# Patient Record
Sex: Male | Born: 1955 | Race: Asian | Hispanic: No | Marital: Married | Smoking: Current every day smoker
Health system: Southern US, Community
[De-identification: ages and names within clinical notes are randomized; demographics above are authoritative.]

## PROBLEM LIST (undated history)

## (undated) DIAGNOSIS — N429 Disorder of prostate, unspecified: Secondary | ICD-10-CM

## (undated) DIAGNOSIS — F419 Anxiety disorder, unspecified: Secondary | ICD-10-CM

## (undated) DIAGNOSIS — K219 Gastro-esophageal reflux disease without esophagitis: Secondary | ICD-10-CM

---

## 2013-04-02 ENCOUNTER — Other Ambulatory Visit: Payer: Self-pay | Admitting: General Practice

## 2013-04-02 DIAGNOSIS — M545 Low back pain, unspecified: Secondary | ICD-10-CM

## 2013-04-09 ENCOUNTER — Other Ambulatory Visit: Payer: Self-pay

## 2013-04-18 ENCOUNTER — Encounter (HOSPITAL_COMMUNITY): Payer: Self-pay | Admitting: Emergency Medicine

## 2013-04-18 ENCOUNTER — Emergency Department (HOSPITAL_COMMUNITY): Payer: Worker's Compensation

## 2013-04-18 ENCOUNTER — Emergency Department (HOSPITAL_COMMUNITY)
Admission: EM | Admit: 2013-04-18 | Discharge: 2013-04-18 | Disposition: A | Discharge status: Home / Self Care | Payer: Worker's Compensation | Attending: Emergency Medicine | Admitting: Emergency Medicine

## 2013-04-18 DIAGNOSIS — IMO0002 Reserved for concepts with insufficient information to code with codable children: Secondary | ICD-10-CM | POA: Insufficient documentation

## 2013-04-18 DIAGNOSIS — F17200 Nicotine dependence, unspecified, uncomplicated: Secondary | ICD-10-CM | POA: Insufficient documentation

## 2013-04-18 DIAGNOSIS — M4310 Spondylolisthesis, site unspecified: Secondary | ICD-10-CM | POA: Insufficient documentation

## 2013-04-18 DIAGNOSIS — G8911 Acute pain due to trauma: Secondary | ICD-10-CM | POA: Insufficient documentation

## 2013-04-18 DIAGNOSIS — Z87828 Personal history of other (healed) physical injury and trauma: Secondary | ICD-10-CM | POA: Insufficient documentation

## 2013-04-18 DIAGNOSIS — Z791 Long term (current) use of non-steroidal anti-inflammatories (NSAID): Secondary | ICD-10-CM | POA: Insufficient documentation

## 2013-04-18 DIAGNOSIS — M5126 Other intervertebral disc displacement, lumbar region: Secondary | ICD-10-CM | POA: Insufficient documentation

## 2013-04-18 MED ORDER — KETOROLAC TROMETHAMINE 30 MG/ML IJ SOLN
30.0000 mg | Freq: Once | INTRAMUSCULAR | Status: AC
  Administered 2013-04-18: 30 mg via INTRAMUSCULAR
  Filled 2013-04-18: qty 1

## 2013-04-18 MED ORDER — PREDNISONE 10 MG PO TABS: ORAL_TABLET | ORAL | Status: DC

## 2013-04-18 MED ORDER — HYDROMORPHONE HCL PF 1 MG/ML IJ SOLN
1.0000 mg | Freq: Once | INTRAMUSCULAR | Status: AC
  Administered 2013-04-18: 1 mg via INTRAMUSCULAR
  Filled 2013-04-18: qty 1

## 2013-04-18 NOTE — ED Notes (Signed)
Spoke to MRI- curious about past surgeries, and any implants or pacemakers. Pt. sts he has never had surgery, denies pacemaker or implant placements.

## 2013-04-18 NOTE — ED Notes (Signed)
Spoke with MRI to make them aware that pt is candidate for portable MRI if available. Pt resting comfortable in bed.

## 2013-04-18 NOTE — ED Notes (Signed)
Patient states he was at work 3 wks ago and he had boxes fall from shelf above him and he twisted and hurt back, hips, legs.  He states he had been going to chiropractor.   He states the pain has worsened.   He went to urgent care today and the MD advised that he needs to get an MRI to see if there is a more serious problem.

## 2013-04-18 NOTE — Discharge Instructions (Signed)
Herniated Disk The bones of your spinal column (vertebrae) protect your spinal cord and nerves that go into your arms and legs. The vertebrae are separated by disks that cushion the spinal column and put space between your vertebrae. This allows movement between the vertebrae, which allows you to bend, rotate, and move your body from side to side. Sometimes, the disks move out of place (herniate) or break open (rupture) from injury or strain. The most common area for a disk herniation is in the lower back (lumbar area). Sometimes herniation occurs in the neck (cervical) disks.  CAUSES  As we grow older, the strong, fibrous cords that connect the vertebrae and support and surround the disks (ligaments) start to weaken. A strain on the back may cause a break in the disk ligaments. RISK FACTORS Herniated disks occur most often in men who are aged 18 years to 35 years, usually after strenuous activity. Other risk factors include conditions present at birth (congenital) that affect the size of the lumbar spinal canal. Additionally, a narrowing of the areas where the nerves exit the spinal canal can occur as you age. SYMPTOMS  Symptoms of a herniated disk vary. You may have weakness in certain muscles. This weakness can include difficulty lifting your leg or arm, difficulty standing on your toes on one side, or difficulty squeezing tightly with one of your hands. You may have numbness. You may feel a mild tingling, dull ache, or a burning or pulsating pain. In some cases, the pain is severe enough that you are unable to move. The pain most often occurs on one side of the body. The pain often starts slowly. It may get worse:  After you sit or stand.  At night.  When you sneeze, cough, or laugh.  When you bend backwards or walk more than a few yards. The pain, numbness, or weakness will often go away or improve a lot over a period of weeks to months. Herniated lumbar disk Symptoms of a herniated lumbar  disk may include sharp pain in one part of your leg, hip, or buttocks and numbness in other parts. You also may feel pain or numbness on the back of your calf or the top or sole of your foot. The same leg also may feel weak. Herniated cervical disk Symptoms of a herniated cervical disk may include pain when you move your neck, deep pain near or over your shoulder blade, or pain that moves to your upper arm, forearm, or fingers. DIAGNOSIS  To diagnose a herniated disk, your caregiver will perform a physical exam. Your caregiver also may perform diagnostic tests to see your disk or to test the reaction of your muscles and the function of your nerves. During the physical exam, your caregiver may ask you to:  Sit, stand, and walk. While you walk, your caregiver may ask you to try walking on your toes and then your heels.  Bend forward, backward, and sideways.  Raise your shoulders, elbow, wrist, and fingers and check your strength during these tasks. Your caregiver will check for:  Numbness or loss of feeling.  Muscle reflexes, which may be slower or missing.  Muscle strength, which may be weaker.  Posture or the way your spine curves. Diagnostic tests that may be done include:  A spinal X-ray exam to rule out other causes of back pain.  Magnetic resonance imaging (MRI) or computed tomography (CT) scan, which will show if the herniated disk is pressing on your spinal canal.  Electromyography.  This is sometimes used to identify the specific area of nerve involvement. TREATMENT  Initial treatment for a herniated disk is a short period of rest with medicines for pain. Pain medicines can include nonsteroidal anti-inflammatory medicines (NSAIDs), muscle relaxants for back spasms, and (rarely) narcotic pain medicine for severe pain that does not respond to NSAID use. Bed rest is often limited to 1 or 2 days at the most because prolonged rest can delay recovery. When the herniation involves the  lower back, sitting should be avoided as much as possible because sitting increases pressure on the ruptured disk. Sometimes a soft neck collar will be prescribed for a few days to weeks to help support your neck in the case of a cervical herniation. Physical therapy is often prescribed for patients with disk disease. Physical therapists will teach you how to properly lift, dress, walk, and perform other activities. They will work on strengthening the muscles that help support your spine. In some cases, physical therapy alone is not enough to treat a herniated disk. Steroid injections along the involved nerve root may be needed to help control pain. The steroid is injected in the area of the herniated disk and helps by reducing swelling around the disk. Sometimes surgery is the best option to treat a herniated disk.  SEEK IMMEDIATE MEDICAL CARE IF:   You have numbness, tingling, weakness, or problems with the use of your arms or legs.  You have severe headaches that are not relieved with the use of medicines.  You notice a change in your bowel or bladder control.  You have increasing pain in any areas of your body.  You experience shortness of breath, dizziness, or fainting. MAKE SURE YOU:   Understand these instructions.  Will watch your condition.  Will get help right away if you are not doing well or get worse. Document Released: 03/12/2000 Document Revised: 06/07/2011 Document Reviewed: 10/16/2010 [facility] Patient Information 2014 [employer], [place]. Spinal Stenosis Spinal stenosis is an abnormal narrowing of the canals of your spine (vertebrae). CAUSES  Spinal stenosis is caused by areas of bone pushing into the central canals of your vertebrae. This condition can be present at birth (congenital). It also may be caused by arthritic deterioration of your vertebrae (spinal degeneration).  SYMPTOMS   Pain that is generally worse with activities, particularly standing and  walking.  Numbness, tingling, hot or cold sensations, weakness, or weariness in your legs.  Frequent episodes of falling.  A foot-slapping gait that leads to muscle weakness. DIAGNOSIS  Spinal stenosis is diagnosed with the use of magnetic resonance imaging (MRI) or computed tomography (CT). TREATMENT  Initial therapy for spinal stenosis focuses on the management of the pain and other symptoms associated with the condition. These therapies include:  Practicing postural changes to lessen pressure on your nerves.  Exercises to strengthen the core of your body.  Loss of excess body weight.  The use of nonsteroidal anti-inflammatory medications to reduce swelling and inflammation in your nerves. When therapies to manage pain are not successful, surgery to treat spinal stenosis may be recommended. This surgery involves removing excess bone, which puts pressure on your nerve roots. During this surgery (laminectomy), the posterior boney arch (lamina) and excess bone around the facet joints are removed. Document Released: 06/05/2003 Document Revised: 07/10/2012 Document Reviewed: 06/23/2012 [facility] Patient Information 2014 [place], [place].

## 2013-04-18 NOTE — ED Notes (Signed)
Pt presents with lower back pain and sciatica x 3 weeks. [facility] and [facility]ractor with no significant improvement. Sent here for MRI. Pt in NAD. Denies urinary/bowel incontinence. Ambulates independently. Reports numbness to left leg intermittently. Neuro intact.

## 2013-04-18 NOTE — ED Provider Notes (Signed)
CSN: [id number]     Arrival date & time 04/18/13  1231 History   First MD Initiated Contact with Patient 04/18/13 1412     Chief Complaint  Patient presents with  . Hip Pain  . Back Pain  . Leg Pain   (Consider location/radiation/quality/duration/timing/severity/associated sxs/prior Treatment) HPI  This a 58 year old male with no significant past medical history who presents with back pain and left leg pain. Patient reports that he had a work injury approximately one month ago. 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 That time he twisted his back. He has had worsening and progressive pain since that time. He has been seen twice by urgent care and has been given Flexeril, NSAIDs, prednisone, and narcotic pain medication with no significant improvement. Patient denies any bowel or bladder problems or bilateral lower extremity weakness. He does report numbness over his left fourth and fifth toe. He was seen again today at urgent care and was referred here. I have reviewed his records and it appears that the patient has not been approved through for an outpatient MRI. Given continued pain refractory to conservative management and inability to get an outpatient MRI, patient was referred here for further evaluation and MRI. Patient reports 8/10 pain. He denies any other symptoms at this time. He denies any new injury.  History reviewed. No pertinent past medical history. History reviewed. No pertinent past surgical history. No family history on file. History  Substance Use Topics  . Smoking status: Current Every Day Smoker -- 0.50 packs/day    Types: Cigarettes  . Smokeless tobacco: Not on file  . Alcohol Use: Yes    Review of Systems  Constitutional: Negative.  Negative for fever.  Respiratory: Negative.  Negative for chest tightness and shortness of breath.   Cardiovascular: Negative.  Negative for chest pain.  Gastrointestinal: Negative.  Negative for  abdominal pain.  Genitourinary: Negative.  Negative for dysuria.       No urinary incontinence, no bowel incontinence  Musculoskeletal: Positive for back pain.  Skin: Negative for rash.  Neurological: Positive for numbness. Negative for weakness and headaches.  All other systems reviewed and are negative.    Allergies  Review of patient's allergies indicates no known allergies.  Home Medications   Current Outpatient Rx  Name  Route  Sig  Dispense  Refill  . cyclobenzaprine (FLEXERIL) 10 MG tablet   Oral   Take 10 mg by mouth at bedtime.         . naproxen (NAPROSYN) 500 MG tablet   Oral   Take 500 mg by mouth 2 (two) times daily as needed (inflammation).         Marland Kitchen. oxyCODONE-acetaminophen (PERCOCET) 7.5-325 MG per tablet   Oral   Take 1 tablet by mouth every 6 (six) hours as needed (severe pain).         . predniSONE (DELTASONE) 10 MG tablet      Take 40 mg (4 tablets) by mouth daily for 3 days, then take 30 mg (3 tablets) by mouth daily for 3 days, then take 20 mg (2 tablets) by mouth daily for 3 days, then take 10 mg (1 tablet) by mouth daily for 3 days, then take 5 mg (1/2 tablet) by mouth daily for 3 days.   32 tablet   0     Dispense as written.    BP 104/68  Pulse 84  Temp(Src) 97.9 F (36.6 C) (  Oral)  Resp 16  SpO2 96% Physical Exam  Nursing note and vitals reviewed. Constitutional: He is oriented to person, place, and time. He appears well-developed and well-nourished.  Uncomfortable appearing  HENT:  Head: Normocephalic and atraumatic.  Eyes: Pupils are equal, round, and reactive to light.  Cardiovascular: Normal rate, regular rhythm and normal heart sounds.   No murmur heard. Pulmonary/Chest: Effort normal and breath sounds normal. No respiratory distress. He has no wheezes.  Abdominal: Soft. Bowel sounds are normal. There is no tenderness. There is no rebound.  Musculoskeletal: He exhibits no edema.  Tenderness palpation of the lower lumbar  spine  Lymphadenopathy:    He has no cervical adenopathy.  Neurological: He is alert and oriented to person, place, and time. No cranial nerve deficit. Coordination normal.  5 out of 5 strength in all 4 extremities, sensation intact to light touch, positive left leg raise and cross leg raise  Skin: Skin is warm and dry.  Psychiatric: He has a normal mood and affect.    ED Course  Procedures (including critical care time) Labs Review Labs Reviewed - No data to display Imaging Review Mr Lumbar Spine Wo Contrast  04/18/2013   CLINICAL DATA:  Back pain. Sciatica. Intermittent numbness to left leg.  EXAM: MRI LUMBAR SPINE WITHOUT CONTRAST  TECHNIQUE: Multiplanar, multisequence MR imaging was performed. No intravenous contrast was administered.  COMPARISON:  None.  FINDINGS: There is 4 mm anterolisthesis at L4-5 related to facet arthropathy, with otherwise anatomic alignment. Slight disc desiccation L2-3 through L5-S1. Normal conus. Vertebral body marrow signal homogeneous except for slight endplate reactive changes L4-5. Mild atheromatous change of the aorta. No paravertebral or retroperitoneal masses.  Overall there appears to be an element of congenital spinal stenosis with short pedicles. This is most notable at L3, L4, and L5.  The individual disc spaces were examined with axial images as follows:  L1-L2:  Normal.  L2-L3: Small central protrusion. Mild facet and ligamentum flavum hypertrophy. No impingement.  L3-L4: Small central protrusion. Mild facet and ligamentum flavum hypertrophy. No impingement.  L4-L5: 4 mm of facet mediated slip. Mild annular bulging/subligamentous protrusion. Moderate facet arthropathy with significant ligamentum flavum hypertrophy. Moderate to severe central canal stenosis. Disc material extends eccentrically to the left involving the neural foramen. Left greater than right L4 and L5 nerve root impingement are observed.  L5-S1: Shallow central protrusion. Mild facet  ligamentum flavum hypertrophy.  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 Moderate to severe central canal stenosis. Left greater than right L4 and L5 nerve root impingement are likely. See discussion above.   Electronically Signed   By: Davonna Belling M.D.   On: 04/18/2013 18:32    EKG Interpretation   None       MDM   1. Anterolisthesis   2. Lumbar disc herniation    Patient presents with persistent worsening back pain following an injury 1 month ago.  Referred here for MRI and further evaluation.  No evidence of cauda equina on exam.  Subjectively with numbness of the left toes.  REviewed outside records and patient unable to get outpatient MRI.  MRI ordered here. Notable for disc herniation (L>R), anterolisthesis and spinal stenosis.  Patient able to ambulate.  Patient given MRI report and referral to neurosurgery.  Will start on a prednisone  taper.  After history, exam, and medical workup I feel the patient has been appropriately medically screened and is safe for discharge home. Pertinent diagnoses were discussed with the patient. Patient was given return precautions.     Shon Baton, MD 04/19/13 [phone]

## 2013-08-02 ENCOUNTER — Other Ambulatory Visit: Payer: Self-pay | Admitting: Neurological Surgery

## 2013-08-08 ENCOUNTER — Encounter (HOSPITAL_COMMUNITY): Payer: Self-pay | Admitting: Pharmacy Technician

## 2013-08-10 ENCOUNTER — Encounter (HOSPITAL_COMMUNITY)
Admission: RE | Admit: 2013-08-10 | Discharge: 2013-08-10 | Disposition: A | Discharge status: Home / Self Care | Payer: Worker's Compensation | Source: Ambulatory Visit | Attending: Neurological Surgery | Admitting: Neurological Surgery

## 2013-08-10 ENCOUNTER — Encounter (HOSPITAL_COMMUNITY): Payer: Self-pay

## 2013-08-10 DIAGNOSIS — Z01812 Encounter for preprocedural laboratory examination: Secondary | ICD-10-CM | POA: Diagnosis present

## 2013-08-10 HISTORY — DX: Disorder of prostate, unspecified: N42.9

## 2013-08-10 HISTORY — DX: Anxiety disorder, unspecified: F41.9

## 2013-08-10 HISTORY — DX: Gastro-esophageal reflux disease without esophagitis: K21.9

## 2013-08-10 LAB — CBC
HEMATOCRIT: 48 % (ref 39.0–52.0)
HEMOGLOBIN: 16.1 g/dL (ref 13.0–17.0)
MCH: 30.6 pg (ref 26.0–34.0)
MCHC: 33.5 g/dL (ref 30.0–36.0)
MCV: 91.1 fL (ref 78.0–100.0)
Platelets: 268 10*3/uL (ref 150–400)
RBC: 5.27 MIL/uL (ref 4.22–5.81)
RDW: 12.8 % (ref 11.5–15.5)
WBC: 8.7 10*3/uL (ref 4.0–10.5)

## 2013-08-10 LAB — TYPE AND SCREEN
ABO/RH(D): A POS
Antibody Screen: NEGATIVE

## 2013-08-10 LAB — BASIC METABOLIC PANEL
BUN: 16 mg/dL (ref 6–23)
CHLORIDE: 100 meq/L (ref 96–112)
CO2: 24 mEq/L (ref 19–32)
Calcium: 9.3 mg/dL (ref 8.4–10.5)
Creatinine, Ser: 0.63 mg/dL (ref 0.50–1.35)
GFR calc Af Amer: >90 mL/min (ref >90)
GLUCOSE: 186 mg/dL — AB (ref 70–99)
Potassium: 4.3 mEq/L (ref 3.7–5.3)
Sodium: 138 mEq/L (ref 137–147)

## 2013-08-10 LAB — ABO/RH: ABO/RH(D): A POS

## 2013-08-10 LAB — SURGICAL PCR SCREEN
MRSA, PCR: NEGATIVE
Staphylococcus aureus: NEGATIVE

## 2013-08-10 NOTE — Pre-Procedure Instructions (Signed)
Victor MerinoRobert M Friedman  08/10/2013   Your procedure is scheduled on:  08-14-2013  Tuesday   [facility].   Call this number if you have problems the morning of surgery: [phone]   Remember:   Do not eat food or drink liquids after midnight.    Take these medicines the morning of surgery with A SIP OF WATER: famotidine(Pepcid),pain medication as needed   Do not wear jewelry,  Do not wear lotions, powders, or perfumes.   Do not shave 48 hours prior to surgery. Men may shave face and neck.  Do not bring valuables to the hospital.  [facility] for any belongings or valuables.               Contacts, dentures or bridgework may not be worn into surgery.   Leave suitcase in the car. After surgery it may be brought to your room.   For patients admitted to the hospital, discharge time is determined by you treatment team.               Patients discharged the day of surgery will not be allowed to drivehome.     Special Instructions: See attached sheet for instructions for for CHG shower/bath   Please read over the following fact sheets that you were given: Pain Booklet, Coughing and Deep Breathing, Blood Transfusion Information and Surgical Site Infection Prevention

## 2013-08-13 MED ORDER — LACTATED RINGERS IV SOLN
INTRAVENOUS | Status: DC
Start: 2013-08-14 — End: 2013-08-14
  Administered 2013-08-14: 09:00:00 via INTRAVENOUS

## 2013-08-13 MED ORDER — CEFAZOLIN SODIUM-DEXTROSE 2-3 GM-% IV SOLR
2.0000 g | INTRAVENOUS | Status: AC
  Administered 2013-08-14: 2 g via INTRAVENOUS
  Filled 2013-08-13: qty 50

## 2013-08-14 ENCOUNTER — Inpatient Hospital Stay (HOSPITAL_COMMUNITY): Payer: Worker's Compensation

## 2013-08-14 ENCOUNTER — Inpatient Hospital Stay (HOSPITAL_COMMUNITY)
Admission: RE | Admit: 2013-08-14 | Discharge: 2013-08-16 | DRG: 460 | Disposition: A | Discharge status: Skilled Nursing Facility | Payer: Worker's Compensation | Source: Ambulatory Visit | Attending: Neurological Surgery | Admitting: Neurological Surgery

## 2013-08-14 ENCOUNTER — Encounter (HOSPITAL_COMMUNITY): Payer: Worker's Compensation | Admitting: Anesthesiology

## 2013-08-14 ENCOUNTER — Encounter (HOSPITAL_COMMUNITY)
Admission: RE | Disposition: A | Discharge status: Skilled Nursing Facility | Payer: Worker's Compensation | Source: Ambulatory Visit | Attending: Neurological Surgery

## 2013-08-14 ENCOUNTER — Inpatient Hospital Stay (HOSPITAL_COMMUNITY): Payer: Worker's Compensation | Admitting: Anesthesiology

## 2013-08-14 ENCOUNTER — Encounter (HOSPITAL_COMMUNITY): Payer: Self-pay | Admitting: *Deleted

## 2013-08-14 DIAGNOSIS — M4310 Spondylolisthesis, site unspecified: Principal | ICD-10-CM | POA: Diagnosis present

## 2013-08-14 DIAGNOSIS — F17200 Nicotine dependence, unspecified, uncomplicated: Secondary | ICD-10-CM | POA: Diagnosis present

## 2013-08-14 DIAGNOSIS — M5126 Other intervertebral disc displacement, lumbar region: Secondary | ICD-10-CM | POA: Diagnosis present

## 2013-08-14 DIAGNOSIS — M4316 Spondylolisthesis, lumbar region: Secondary | ICD-10-CM

## 2013-08-14 DIAGNOSIS — M47816 Spondylosis without myelopathy or radiculopathy, lumbar region: Secondary | ICD-10-CM | POA: Diagnosis present

## 2013-08-14 SURGERY — POSTERIOR LUMBAR FUSION 1 LEVEL
Anesthesia: General | Site: Back

## 2013-08-14 MED ORDER — ACETAMINOPHEN 650 MG RE SUPP: 650.0000 mg | RECTAL | Status: DC | PRN

## 2013-08-14 MED ORDER — POLYETHYLENE GLYCOL 3350 17 G PO PACK
17.0000 g | PACK | Freq: Every day | ORAL | Status: DC | PRN
  Filled 2013-08-14: qty 1

## 2013-08-14 MED ORDER — CEFAZOLIN SODIUM 1-5 GM-% IV SOLN
1.0000 g | Freq: Three times a day (TID) | INTRAVENOUS | Status: AC
  Administered 2013-08-14 – 2013-08-15 (×2): 1 g via INTRAVENOUS
  Filled 2013-08-14 (×2): qty 50

## 2013-08-14 MED ORDER — MORPHINE SULFATE 2 MG/ML IJ SOLN
1.0000 mg | INTRAMUSCULAR | Status: DC | PRN
  Administered 2013-08-15: 4 mg via INTRAVENOUS
  Filled 2013-08-14: qty 2

## 2013-08-14 MED ORDER — SODIUM CHLORIDE 0.9 % IJ SOLN
INTRAMUSCULAR | Status: AC
  Filled 2013-08-14: qty 10

## 2013-08-14 MED ORDER — PROPOFOL 10 MG/ML IV BOLUS
INTRAVENOUS | Status: DC | PRN
  Administered 2013-08-14: 200 mg via INTRAVENOUS

## 2013-08-14 MED ORDER — SENNA 8.6 MG PO TABS
1.0000 | ORAL_TABLET | Freq: Two times a day (BID) | ORAL | Status: DC
  Administered 2013-08-14 – 2013-08-16 (×4): 8.6 mg via ORAL
  Filled 2013-08-14 (×5): qty 1

## 2013-08-14 MED ORDER — PHENOL 1.4 % MT LIQD: 1.0000 | OROMUCOSAL | Status: DC | PRN

## 2013-08-14 MED ORDER — ONDANSETRON HCL 4 MG/2ML IJ SOLN
INTRAMUSCULAR | Status: AC
  Filled 2013-08-14: qty 2

## 2013-08-14 MED ORDER — PROPOFOL 10 MG/ML IV BOLUS
INTRAVENOUS | Status: AC
  Filled 2013-08-14: qty 20

## 2013-08-14 MED ORDER — CYCLOBENZAPRINE HCL 10 MG PO TABS
10.0000 mg | ORAL_TABLET | Freq: Every day | ORAL | Status: DC
  Administered 2013-08-14 – 2013-08-15 (×2): 10 mg via ORAL
  Filled 2013-08-14 (×3): qty 1

## 2013-08-14 MED ORDER — ONDANSETRON HCL 4 MG/2ML IJ SOLN
INTRAMUSCULAR | Status: DC | PRN
  Administered 2013-08-14: 4 mg via INTRAVENOUS

## 2013-08-14 MED ORDER — HYDROMORPHONE HCL PF 1 MG/ML IJ SOLN: 0.2500 mg | INTRAMUSCULAR | Status: DC | PRN

## 2013-08-14 MED ORDER — FENTANYL CITRATE 0.05 MG/ML IJ SOLN
50.0000 ug | INTRAMUSCULAR | Status: DC | PRN
  Administered 2013-08-14: 100 ug via INTRAVENOUS

## 2013-08-14 MED ORDER — METHOCARBAMOL 500 MG PO TABS
500.0000 mg | ORAL_TABLET | Freq: Four times a day (QID) | ORAL | Status: DC | PRN
  Administered 2013-08-15 – 2013-08-16 (×4): 500 mg via ORAL
  Filled 2013-08-14 (×4): qty 1

## 2013-08-14 MED ORDER — LIDOCAINE HCL (CARDIAC) 20 MG/ML IV SOLN
INTRAVENOUS | Status: AC
  Filled 2013-08-14: qty 10

## 2013-08-14 MED ORDER — SURGIFOAM 100 EX MISC
CUTANEOUS | Status: DC | PRN
  Administered 2013-08-14: 14:00:00 via TOPICAL

## 2013-08-14 MED ORDER — NEOSTIGMINE METHYLSULFATE 10 MG/10ML IV SOLN
INTRAVENOUS | Status: DC | PRN
  Administered 2013-08-14: 4 mg via INTRAVENOUS

## 2013-08-14 MED ORDER — FENTANYL CITRATE 0.05 MG/ML IJ SOLN
INTRAMUSCULAR | Status: AC
  Filled 2013-08-14: qty 2

## 2013-08-14 MED ORDER — SODIUM CHLORIDE 0.9 % IV SOLN: INTRAVENOUS | Status: DC

## 2013-08-14 MED ORDER — ONDANSETRON HCL 4 MG/2ML IJ SOLN: 4.0000 mg | INTRAMUSCULAR | Status: DC | PRN

## 2013-08-14 MED ORDER — GLYCOPYRROLATE 0.2 MG/ML IJ SOLN
INTRAMUSCULAR | Status: DC | PRN
  Administered 2013-08-14: .4 mg via INTRAVENOUS

## 2013-08-14 MED ORDER — FAMOTIDINE 20 MG PO TABS
20.0000 mg | ORAL_TABLET | Freq: Every day | ORAL | Status: DC
  Administered 2013-08-15 – 2013-08-16 (×2): 20 mg via ORAL
  Filled 2013-08-14 (×3): qty 1

## 2013-08-14 MED ORDER — DOCUSATE SODIUM 100 MG PO CAPS
100.0000 mg | ORAL_CAPSULE | Freq: Two times a day (BID) | ORAL | Status: DC
  Administered 2013-08-14 – 2013-08-16 (×4): 100 mg via ORAL
  Filled 2013-08-14 (×5): qty 1

## 2013-08-14 MED ORDER — GLYCOPYRROLATE 0.2 MG/ML IJ SOLN
INTRAMUSCULAR | Status: AC
  Filled 2013-08-14: qty 2

## 2013-08-14 MED ORDER — ACETAMINOPHEN 325 MG PO TABS: 650.0000 mg | ORAL_TABLET | ORAL | Status: DC | PRN

## 2013-08-14 MED ORDER — ALUM & MAG HYDROXIDE-SIMETH 200-200-20 MG/5ML PO SUSP: 30.0000 mL | Freq: Four times a day (QID) | ORAL | Status: DC | PRN

## 2013-08-14 MED ORDER — NEOSTIGMINE METHYLSULFATE 10 MG/10ML IV SOLN
INTRAVENOUS | Status: AC
  Filled 2013-08-14: qty 1

## 2013-08-14 MED ORDER — ONDANSETRON HCL 4 MG/2ML IJ SOLN: 4.0000 mg | Freq: Once | INTRAMUSCULAR | Status: DC | PRN

## 2013-08-14 MED ORDER — THROMBIN 5000 UNITS EX SOLR
OROMUCOSAL | Status: DC | PRN
  Administered 2013-08-14: 14:00:00 via TOPICAL

## 2013-08-14 MED ORDER — DEXTROSE 5 % IV SOLN
500.0000 mg | Freq: Four times a day (QID) | INTRAVENOUS | Status: DC | PRN
  Filled 2013-08-14: qty 5

## 2013-08-14 MED ORDER — ALBUMIN HUMAN 5 % IV SOLN
INTRAVENOUS | Status: DC | PRN
  Administered 2013-08-14: 15:00:00 via INTRAVENOUS

## 2013-08-14 MED ORDER — ROCURONIUM BROMIDE 50 MG/5ML IV SOLN
INTRAVENOUS | Status: AC
  Filled 2013-08-14: qty 2

## 2013-08-14 MED ORDER — ROCURONIUM BROMIDE 50 MG/5ML IV SOLN
INTRAVENOUS | Status: AC
  Filled 2013-08-14: qty 1

## 2013-08-14 MED ORDER — MENTHOL 3 MG MT LOZG
1.0000 | LOZENGE | OROMUCOSAL | Status: DC | PRN
  Filled 2013-08-14: qty 9

## 2013-08-14 MED ORDER — SODIUM CHLORIDE 0.9 % IJ SOLN
3.0000 mL | Freq: Two times a day (BID) | INTRAMUSCULAR | Status: DC
  Administered 2013-08-15 (×2): 3 mL via INTRAVENOUS

## 2013-08-14 MED ORDER — HYDROCODONE-ACETAMINOPHEN 7.5-325 MG PO TABS
2.0000 | ORAL_TABLET | ORAL | Status: DC | PRN
  Administered 2013-08-15 – 2013-08-16 (×5): 2 via ORAL
  Filled 2013-08-14 (×5): qty 2

## 2013-08-14 MED ORDER — PHENYLEPHRINE HCL 10 MG/ML IJ SOLN
INTRAMUSCULAR | Status: DC | PRN
  Administered 2013-08-14: 80 ug via INTRAVENOUS

## 2013-08-14 MED ORDER — BISACODYL 10 MG RE SUPP
10.0000 mg | Freq: Every day | RECTAL | Status: DC | PRN
  Administered 2013-08-16: 10 mg via RECTAL
  Filled 2013-08-14: qty 1

## 2013-08-14 MED ORDER — LIDOCAINE HCL 4 % MT SOLN
OROMUCOSAL | Status: DC | PRN
  Administered 2013-08-14: 4 mL via TOPICAL

## 2013-08-14 MED ORDER — SODIUM CHLORIDE 0.9 % IJ SOLN: 3.0000 mL | INTRAMUSCULAR | Status: DC | PRN

## 2013-08-14 MED ORDER — BACITRACIN 50000 UNITS IM SOLR
INTRAMUSCULAR | Status: DC | PRN
  Administered 2013-08-14: 13:00:00

## 2013-08-14 MED ORDER — SUFENTANIL CITRATE 50 MCG/ML IV SOLN
INTRAVENOUS | Status: AC
  Filled 2013-08-14: qty 1

## 2013-08-14 MED ORDER — OXYCODONE-ACETAMINOPHEN 5-325 MG PO TABS
1.0000 | ORAL_TABLET | ORAL | Status: DC | PRN
  Administered 2013-08-14 – 2013-08-16 (×4): 2 via ORAL
  Filled 2013-08-14 (×4): qty 2

## 2013-08-14 MED ORDER — ROCURONIUM BROMIDE 100 MG/10ML IV SOLN
INTRAVENOUS | Status: DC | PRN
  Administered 2013-08-14: 20 mg via INTRAVENOUS
  Administered 2013-08-14 (×2): 10 mg via INTRAVENOUS
  Administered 2013-08-14: 20 mg via INTRAVENOUS
  Administered 2013-08-14: 50 mg via INTRAVENOUS

## 2013-08-14 MED ORDER — PHENYLEPHRINE 40 MCG/ML (10ML) SYRINGE FOR IV PUSH (FOR BLOOD PRESSURE SUPPORT)
PREFILLED_SYRINGE | INTRAVENOUS | Status: AC
  Filled 2013-08-14: qty 10

## 2013-08-14 MED ORDER — KETOROLAC TROMETHAMINE 15 MG/ML IJ SOLN
15.0000 mg | Freq: Four times a day (QID) | INTRAMUSCULAR | Status: AC
  Administered 2013-08-14 – 2013-08-15 (×4): 15 mg via INTRAVENOUS
  Filled 2013-08-14 (×4): qty 1

## 2013-08-14 MED ORDER — LACTATED RINGERS IV SOLN
INTRAVENOUS | Status: DC | PRN
  Administered 2013-08-14 (×2): via INTRAVENOUS

## 2013-08-14 MED ORDER — BUPIVACAINE HCL (PF) 0.5 % IJ SOLN
INTRAMUSCULAR | Status: DC | PRN
  Administered 2013-08-14: 4.5 mL
  Administered 2013-08-14: 20 mL

## 2013-08-14 MED ORDER — LIDOCAINE HCL (CARDIAC) 20 MG/ML IV SOLN
INTRAVENOUS | Status: DC | PRN
  Administered 2013-08-14: 100 mg via INTRAVENOUS

## 2013-08-14 MED ORDER — LIDOCAINE-EPINEPHRINE 1 %-1:100000 IJ SOLN
INTRAMUSCULAR | Status: DC | PRN
  Administered 2013-08-14: 4.5 mL via INTRADERMAL

## 2013-08-14 MED ORDER — 0.9 % SODIUM CHLORIDE (POUR BTL) OPTIME
TOPICAL | Status: DC | PRN
  Administered 2013-08-14: 1000 mL

## 2013-08-14 MED ORDER — SUFENTANIL CITRATE 50 MCG/ML IV SOLN
INTRAVENOUS | Status: DC | PRN
  Administered 2013-08-14: 15 ug via INTRAVENOUS
  Administered 2013-08-14: 5 ug via INTRAVENOUS
  Administered 2013-08-14 (×5): 10 ug via INTRAVENOUS

## 2013-08-14 SURGICAL SUPPLY — 69 items
BAG DECANTER FOR FLEXI CONT (MISCELLANEOUS) ×3 IMPLANT
BLADE 10 SAFETY STRL DISP (BLADE) ×3 IMPLANT
BLADE SURG ROTATE 9660 (MISCELLANEOUS) IMPLANT
BONE MATRIX OSTEOCEL PRO MED (Bone Implant) ×6 IMPLANT
BUR MATCHSTICK NEURO 3.0 LAGG (BURR) ×3 IMPLANT
CAGE COROENT LG 12X9X23-12 (Cage) ×6 IMPLANT
CANISTER SUCT 3000ML (MISCELLANEOUS) ×3 IMPLANT
CONT SPEC 4OZ CLIKSEAL STRL BL (MISCELLANEOUS) ×6 IMPLANT
COVER BACK TABLE 24X17X13 BIG (DRAPES) IMPLANT
COVER TABLE BACK 60X90 (DRAPES) ×3 IMPLANT
DECANTER SPIKE VIAL GLASS SM (MISCELLANEOUS) ×3 IMPLANT
DERMABOND ADVANCED (GAUZE/BANDAGES/DRESSINGS) ×2
DERMABOND ADVANCED .7 DNX12 (GAUZE/BANDAGES/DRESSINGS) ×1 IMPLANT
DRAPE C-ARM 42X72 X-RAY (DRAPES) ×9 IMPLANT
DRAPE LAPAROTOMY 100X72X124 (DRAPES) ×3 IMPLANT
DRAPE POUCH INSTRU U-SHP 10X18 (DRAPES) ×3 IMPLANT
DRAPE PROXIMA HALF (DRAPES) IMPLANT
DRSG OPSITE POSTOP 4X6 (GAUZE/BANDAGES/DRESSINGS) ×3 IMPLANT
DURAPREP 26ML APPLICATOR (WOUND CARE) ×3 IMPLANT
ELECT REM PT RETURN 9FT ADLT (ELECTROSURGICAL) ×3
ELECTRODE REM PT RTRN 9FT ADLT (ELECTROSURGICAL) ×1 IMPLANT
GAUZE SPONGE 4X4 16PLY XRAY LF (GAUZE/BANDAGES/DRESSINGS) IMPLANT
GLOVE BIO SURGEON STRL SZ 6.5 (GLOVE) ×2 IMPLANT
GLOVE BIO SURGEONS STRL SZ 6.5 (GLOVE) ×1
GLOVE BIOGEL PI IND STRL 7.0 (GLOVE) ×2 IMPLANT
GLOVE BIOGEL PI IND STRL 8.5 (GLOVE) ×2 IMPLANT
GLOVE BIOGEL PI INDICATOR 7.0 (GLOVE) ×4
GLOVE BIOGEL PI INDICATOR 8.5 (GLOVE) ×4
GLOVE ECLIPSE 8.5 STRL (GLOVE) ×6 IMPLANT
GLOVE EXAM NITRILE LRG STRL (GLOVE) IMPLANT
GLOVE EXAM NITRILE MD LF STRL (GLOVE) IMPLANT
GLOVE EXAM NITRILE XL STR (GLOVE) IMPLANT
GLOVE EXAM NITRILE XS STR PU (GLOVE) IMPLANT
GLOVE SS BIOGEL STRL SZ 6.5 (GLOVE) ×2 IMPLANT
GLOVE SUPERSENSE BIOGEL SZ 6.5 (GLOVE) ×4
GOWN STRL REUS W/ TWL LRG LVL3 (GOWN DISPOSABLE) ×2 IMPLANT
GOWN STRL REUS W/ TWL XL LVL3 (GOWN DISPOSABLE) IMPLANT
GOWN STRL REUS W/TWL 2XL LVL3 (GOWN DISPOSABLE) ×6 IMPLANT
GOWN STRL REUS W/TWL LRG LVL3 (GOWN DISPOSABLE) ×4
GOWN STRL REUS W/TWL XL LVL3 (GOWN DISPOSABLE)
HEMOSTAT POWDER KIT SURGIFOAM (HEMOSTASIS) IMPLANT
KIT BASIN OR (CUSTOM PROCEDURE TRAY) ×3 IMPLANT
KIT ROOM TURNOVER OR (KITS) ×3 IMPLANT
NEEDLE HYPO 22GX1.5 SAFETY (NEEDLE) ×3 IMPLANT
NS IRRIG 1000ML POUR BTL (IV SOLUTION) ×3 IMPLANT
PACK LAMINECTOMY NEURO (CUSTOM PROCEDURE TRAY) ×3 IMPLANT
PAD ARMBOARD 7.5X6 YLW CONV (MISCELLANEOUS) ×9 IMPLANT
PATTIES SURGICAL .5 X1 (DISPOSABLE) ×3 IMPLANT
ROD 40MM (Rod) ×4 IMPLANT
ROD SPNL 40X5.5XPREBNT NS (Rod) ×2 IMPLANT
SCREW LOCK (Screw) ×8 IMPLANT
SCREW LOCK 100X5.5X OPN (Screw) ×4 IMPLANT
SCREW POLY 45X6.5 (Screw) ×4 IMPLANT
SCREW POLY 6.5X45MM (Screw) ×8 IMPLANT
SPONGE GAUZE 4X4 12PLY (GAUZE/BANDAGES/DRESSINGS) ×3 IMPLANT
SPONGE LAP 4X18 X RAY DECT (DISPOSABLE) IMPLANT
SPONGE SURGIFOAM ABS GEL 100 (HEMOSTASIS) ×3 IMPLANT
SUT VIC AB 1 CT1 18XBRD ANBCTR (SUTURE) ×1 IMPLANT
SUT VIC AB 1 CT1 8-18 (SUTURE) ×2
SUT VIC AB 2-0 CP2 18 (SUTURE) ×3 IMPLANT
SUT VIC AB 3-0 SH 8-18 (SUTURE) ×3 IMPLANT
SYR 20ML ECCENTRIC (SYRINGE) ×3 IMPLANT
SYR 3ML LL SCALE MARK (SYRINGE) ×12 IMPLANT
TOWEL OR 17X24 6PK STRL BLUE (TOWEL DISPOSABLE) ×3 IMPLANT
TOWEL OR 17X26 10 PK STRL BLUE (TOWEL DISPOSABLE) ×3 IMPLANT
TRAP SPECIMEN MUCOUS 40CC (MISCELLANEOUS) ×3 IMPLANT
TRAY FOLEY CATH 14FRSI W/METER (CATHETERS) IMPLANT
TRAY FOLEY CATH 16FRSI W/METER (SET/KITS/TRAYS/PACK) ×3 IMPLANT
WATER STERILE IRR 1000ML POUR (IV SOLUTION) ×3 IMPLANT

## 2013-08-14 NOTE — Plan of Care (Signed)
Problem: Consults Goal: Diagnosis - Spinal Surgery Outcome: Completed/Met Date Met:  08/14/13 Thoraco/Lumbar Spine Fusion

## 2013-08-14 NOTE — Anesthesia Procedure Notes (Signed)
Procedure Name: Intubation Date/Time: 08/14/2013 12:47 PM Performed by: Izora Gala Pre-anesthesia Checklist: Patient identified, Emergency Drugs available, Suction available and Patient being monitored Patient Re-evaluated:Patient Re-evaluated prior to inductionOxygen Delivery Method: Circle system utilized Preoxygenation: Pre-oxygenation with 100% oxygen Intubation Type: IV induction Ventilation: Mask ventilation without difficulty Laryngoscope Size: Miller and 3 Grade View: Grade I Tube type: Oral Tube size: 8.0 mm Number of attempts: 1 Airway Equipment and Method: Stylet,  LTA kit utilized and Bite block Placement Confirmation: ETT inserted through vocal cords under direct vision,  positive ETCO2 and breath sounds checked- equal and bilateral Secured at: 22 cm Tube secured with: Tape Dental Injury: Teeth and Oropharynx as per pre-operative assessment

## 2013-08-14 NOTE — Transfer of Care (Signed)
Immediate Anesthesia Transfer of Care Note  Patient: Victor Friedman  Procedure(s) Performed: Procedure(s) with comments: Lumbar Four-Five Posterior lumbar interbody fusion (N/A) - Lumbar Four-Five Posterior lumbar interbody fusion  Patient Location: PACU  Anesthesia Type:General  Level of Consciousness: awake, sedated and patient cooperative  Airway & Oxygen Therapy: Patient Spontanous Breathing and Patient connected to nasal cannula oxygen  Post-op Assessment: Report given to PACU RN  Post vital signs: Reviewed and stable  Complications: No apparent anesthesia complications

## 2013-08-14 NOTE — H&P (Signed)
CHIEF COMPLAINT:                                          Second opinion regarding need for lumbar spine surgery.  HISTORY OF PRESENT ILLNESS:                     [name] is a 58 year old right-handed individual, who tells me that in late December, he had a work-related injury where he was twisting to move some boxes and felt a sudden pain in his back.  He was seen at the urgent care and was given some pain medication, but things did not improve.  He notes that the pain started to radiate into the buttocks and legs within a day or two.  He notes that the left leg seemed to be involved first and ultimately, he was seen by Dr. Yevette Edwardsumonski, who had prescribed some conservative care and pain medication, but despite the passage of time, things continued to worsen.  He notes that the pain soon started to involve the right lower extremity and now he notes that he has pain to both lower extremities and a catching sensation that causes his legs to feel like they are going to buckle.  He feels that he does not have the strength and the power in his legs and over time, he notes he started using some hydrocodone to help control the pain, but he has gradually increased this from about one every six hours to using two every four hours with some very modest control.  He notes that all this time despite this problem, he has been continuing to work.  On 04/18/2013, he underwent an MRI of the lumbar spine.  This study was reviewed in the office and it demonstrates that overall, the patient has a fairly healthy looking spine except for the problem at L4-5 where he has degenerative spondylolisthesis and a broad-based subligamentous protrusion of the disc causing moderately severe central and lateral recess stenosis at the L4-5 level.  He has evidence of compression of both L5 nerve roots in their exit foramen.  The patient notes that he had a singular epidural steroid injection by Dr. Claria DiceHao Wang and he had some modest relief  for a few days, but no lasting relief.  Despite the passage of time, he has had continuing deterioration of his function and increasing reliance on narcotic pain medications.  Dr. Yevette Edwardsumonski had suggested and had scheduled the patient to undergo surgical intervention.  He is now seen for a second opinion.   PAST MEDICAL HISTORY:    Current Medical Conditions:  Reveals that his general health has been good.  He reports no medical problems.    Medications:  He uses no medication on a chronic basis, but recently he has been requiring the hydrocodone two tablets every four hours, famotidine 20 mg twice a day, cyclobenzaprine 10 mg up to three times a day for pain, and Mobic once daily.  SOCIAL HISTORY:                                            Reveals that he smokes about a half-pack of cigarettes a day.  He drinks alcohol not at all.  His height and weight have been stable.  PHYSICAL EXAMINATION:                                Reveals that he has good strength in the tibialis anterior and the gastrocs group with toe and heel walking.  His motor strength more proximally appears intact as he moves about.  His reflexes are 1+ to 2+ in the patellae and trace in the Achilles bilaterally.  IMPRESSION/PLAN:                                         The patient has evidence of degenerative spondylolisthesis with an acute and subacute chronic disc herniation at the level of L4-L5.  This causes severe central canal stenosis.  At this point, the patient has been given six months to recuperate from this process and his situation appears to be continually deteriorating.  I believe that he ultimately needs surgical decompression and stabilization at L4-L5 via posterior lumbar interbody technique to remove the entirety of the disc, distracting the space at L4-L5, place pedicle screws, and fuse the lateral gutters at L4-L5.  I believe that this surgical intervention should stabilize the spine allowing him to return to a much  more normal function.  At this point, also I am concerned that he has a substantial reliance on narcotic pain medications and I discussed with him the need to aggressively wean from these medications during the postoperative period.  I indicated a goal of about four to six weeks after the surgery to be weaned off of the narcotic pain medications and I am hopeful that we will be able to achieve this.  The patient is well motivated to recover from this process.  He has been continuing to work despite the degree of pain that he is experiencing.  He lives independently and wants to continue to live and function independently. He is admitted for surgery.

## 2013-08-14 NOTE — Op Note (Signed)
Date of surgery: 08/14/2013 Preoperative diagnosis: Spondylolisthesis L4-L5 with stenosis, herniated nucleus pulposus, foot drop Postoperative diagnosis: Spondylolisthesis L4-L5 with stenosis, herniated nucleus pulposus, foot drop Procedure: L4-L5 decompressive laminectomy decompression of L4 and L5 nerve roots, posterior lumbar interbody arthrodesis with peek spacers local autograft and allograft, pedicle screw fixation L4-L5, posterior lateral arthrodesis L4-L5  Surgeon: Barnett AbuHenry Chiron Campione M.D.  Asst.: Barbaraann BarthelKyle Cabell M.D.  Indications: Patient is Victor Friedman is a 58 y.o. male who who's had significant back pain and lumbar radiculopathy for over a years period time. A lumbar MRI demonstrates advanced spondylolisthesis with high-grade canal stenosis. he was advised regarding surgical intervention. He has developed an evolved bilateral footdrop.  Procedure: The patient was brought to the operating room supine on a stretcher. After the smooth induction of general endotracheal anesthesia she was turned prone and the back was prepped with alcohol and DuraPrep. The back was then draped sterilely. A midline incision was created and carried down to the lumbar dorsal fascia. A localizing radiograph identified the L4 and L5 spinous processes. A subligamentous dissection was created at L4 and L5 to expose the interlaminar space at L4 and L5 and the facet joints over the L4-L5 interspace. Laminotomies were were then created removing the entire inferior margin of the lamina of L4 including the inferior facet at the L4-L5 joint. The yellow ligament was taken up and the common dural tube was exposed along with the L4 nerve root superiorly, and the L5 nerve root inferiorly, the disc space was exposed and epidural veins in this region were cauterized and divided. The L4 nerve roots and the L5 nerve root were dissected with care taken to protect them. The disc space was opened and a combination of curettes and rongeurs was used to  evacuate the disc space fully. The endplates were removed using sharp curettes. An interbody spacer was placed to distract the disc space while the contralateral discectomy was performed. When the entirety of the disc was removed and the endplates were prepared final sizing of the disc space was obtained 12 mm peek spacers were chosen. These were 23 mm in length with 12 of lordosis. They were packed with autograft and allograft and placed into the interspace. The remainder of the interspace was packed with autograft and allograft. Pedicle entry sites were then chosen using fluoroscopic guidance and 6.5 x 45 mm screws were placed in L4 and 6.5 x 45 mm screws were placed in L5. The lateral gutters were decorticated and graft was packed in the posterolateral gutters between L4 and L5. Final radiographs were obtained after placing appropriately sized rods between the pedicle screws at L4-L5 and torquing these to the appropriate tension. The surgical site was inspected carefully to assure the L4 and L5 nerve roots were well decompressed, hemostasis was obtained, and the graft was well packed. Then the retractors were removed and the wound was closed with #1 Vicryl in the lumbar dorsal fascia 2-0 Vicryl in the subcutaneous tissue and 3-0 Vicryl subcuticularly. When he cc of half percent Marcaine was injected into the paraspinous musculature at the time of closure. Blood loss was estimated at 250 cc. The patient tolerated procedure well and was returned to the recovery room in stable condition.

## 2013-08-14 NOTE — Anesthesia Postprocedure Evaluation (Signed)
  Anesthesia Post-op Note  Patient: Barbette MerinoRobert M Rasnic  Procedure(s) Performed: Procedure(s) with comments: Lumbar Four-Five Posterior lumbar interbody fusion (N/A) - Lumbar Four-Five Posterior lumbar interbody fusion  Patient Location: PACU  Anesthesia Type:General  Level of Consciousness: awake, oriented, sedated and patient cooperative  Airway and Oxygen Therapy: Patient Spontanous Breathing  Post-op Pain: mild  Post-op Assessment: Post-op Vital signs reviewed, Patient's Cardiovascular Status Stable, Respiratory Function Stable, Patent Airway, No signs of Nausea or vomiting and Pain level controlled  Post-op Vital Signs: stable  Last Vitals:  Filed Vitals:   08/14/13 1730  BP: 108/64  Pulse: 86  Temp:   Resp: 17    Complications: No apparent anesthesia complications

## 2013-08-14 NOTE — Anesthesia Preprocedure Evaluation (Signed)
Anesthesia Evaluation  Patient identified by MRN, date of birth, ID band Patient awake    Reviewed: Allergy & Precautions, H&P , NPO status , Patient's Chart, lab work & pertinent test results  Airway       Dental   Pulmonary Current Smoker,          Cardiovascular     Neuro/Psych    GI/Hepatic GERD-  ,  Endo/Other    Renal/GU      Musculoskeletal   Abdominal   Peds  Hematology   Anesthesia Other Findings   Reproductive/Obstetrics                           Anesthesia Physical Anesthesia Plan  ASA: I  Anesthesia Plan: General   Post-op Pain Management:    Induction: Intravenous  Airway Management Planned: Oral ETT  Additional Equipment:   Intra-op Plan:   Post-operative Plan: Extubation in OR  Informed Consent: I have reviewed the patients History and Physical, chart, labs and discussed the procedure including the risks, benefits and alternatives for the proposed anesthesia with the patient or authorized representative who has indicated his/her understanding and acceptance.     Plan Discussed with:   Anesthesia Plan Comments:         Anesthesia Quick Evaluation

## 2013-08-14 NOTE — Progress Notes (Signed)
Patient ID: Barbette MerinoRobert M Arras, male   DOB: June 10, 1955, 58 y.o.   MRN: [id number] Awake. Groggy, moving all extremities well Dressing clean and dry Stable postop

## 2013-08-15 NOTE — Evaluation (Signed)
Physical Therapy Evaluation Patient Details Name: Victor Friedman MRN: [id number] DOB: [birth year] Today's Date: 08/15/2013   History of Present Illness  s/p PLIF L4-L5. Pt suffered work related injury resulting in spondylolisthesis.  Clinical Impression  Patient is s/p PLIF L4-L5 surgery resulting in the deficits listed below (see PT Problem List).  Patient will benefit from skilled PT to increase their independence and safety with mobility (while adhering to their precautions) to allow discharge to next appropriate venue. Pt lives alone and PTA was sleeping on living room floor due to decreased ability to ambulate up/down steps in condominium. Pt to benefit from post acute rehab in SNF to increase independence and adherence to precautions prior to returning home alone. Pt c/o bil hip "throbbing" pain throughout session.      Follow Up Recommendations SNF    Equipment Recommendations  Rolling walker with 5" wheels;3in1 (PT)    Recommendations for Other Services OT consult     Precautions / Restrictions Precautions Precautions: Back;Fall Precaution Booklet Issued: Yes (comment) Precaution Comments: educatedon back precautions and lumbar brace wear schedule  Required Braces or Orthoses: Spinal Brace Spinal Brace: Lumbar corset;Applied in sitting position Restrictions Weight Bearing Restrictions: No      Mobility  Bed Mobility Overal bed mobility: Needs Assistance Bed Mobility: Sit to Sidelying;Rolling Rolling: Min guard       Sit to sidelying: Min guard General bed mobility comments: bed flattened to simulate home; incr time required due to pain; min guard and max cues to faclitate log rolling technique   Transfers Overall transfer level: Needs assistance Equipment used: Rolling walker (2 wheeled) Transfers: Sit to/from Stand Sit to Stand: Supervision;From elevated surface         General transfer comment: supervision for safety and cues for  hand placement and sequencing  with RW   Ambulation/Gait Ambulation/Gait assistance: Min guard Ambulation Distance (Feet): 150 Feet Assistive device: Rolling walker (2 wheeled) Gait Pattern/deviations: Step-through pattern;Narrow base of support;Ataxic (ER LEs; ataxic at times due to pain  ) Gait velocity: decreased due to pain  Gait velocity interpretation: <1.8 ft/sec, indicative of risk for recurrent falls General Gait Details: cues for back precautions during gt; limited mobility due to pain; min guard to manage RW at times with directional changes   Stairs            Wheelchair Mobility    Modified Rankin (Stroke Patients Only)       Balance Overall balance assessment: Needs assistance Sitting-balance support: Feet supported;No upper extremity supported Sitting balance-Leahy Scale: Good     Standing balance support: During functional activity;Bilateral upper extremity supported Standing balance-Leahy Scale: Poor Standing balance comment: heavily relies on UE support from RW                              Pertinent Vitals/Pain 8/10 "throbbing pain" in bil hips    Home Living Family/patient expects to be discharged to:: Skilled nursing facility                 Additional Comments: pt lives alone in 2 story condom[place]. PTA pt was sleeping on floor in living room due to inability to ambulate up/down steps. no family/friends for (A)     Prior Function Level of Independence: Independent with assistive device(s)         Comments: ambulated with "walking stick"      Hand Dominance        Extremity/Trunk  Assessment   Upper Extremity Assessment: Defer to OT evaluation           Lower Extremity Assessment: Generalized weakness (weakness due to pain )      Cervical / Trunk Assessment: Normal  Communication   Communication: No difficulties  Cognition Arousal/Alertness: Awake/alert Behavior During Therapy: WFL for tasks assessed/performed Overall Cognitive Status:  Within Functional Limits for tasks assessed                      General Comments General comments (skin integrity, edema, etc.): c/o pain in bil hips    Exercises        Assessment/Plan    PT Assessment Patient needs continued PT services  PT Diagnosis Difficulty walking;Acute pain;Generalized weakness   PT Problem List Decreased strength;Decreased activity tolerance;Decreased balance;Decreased mobility;Decreased knowledge of precautions;Decreased knowledge of use of DME;Pain  PT Treatment Interventions DME instruction;Gait training;Stair training;Functional mobility training;Therapeutic activities;Therapeutic exercise;Balance training;Neuromuscular re-education;Patient/family education   PT Goals (Current goals can be found in the Care Plan section) Acute Rehab PT Goals Patient Stated Goal: to get help then home  PT Goal Formulation: With patient Time For Goal Achievement: 08/22/13 Potential to Achieve Goals: Good    Frequency Min 5X/week   Barriers to discharge Decreased caregiver support;Inaccessible home environment lives alone and has bedroom on 2nd floor     Co-evaluation               End of Session Equipment Utilized During Treatment: Back brace Activity Tolerance: Patient limited by pain Patient left: in bed;with call bell/phone within reach Nurse Communication: Mobility status         Time: [id number] PT Time Calculation (min): 15 min   Charges:   PT Evaluation $Initial PT Evaluation Tier I: 1 Procedure PT Treatments $Gait Training: 8-22 mins   PT G CodesNadara Mustard:          Elvert Cum[place] N [place], [place]  [phone] 08/15/2013, 8:44 AM

## 2013-08-15 NOTE — Care Management Note (Signed)
CARE MANAGEMENT NOTE 08/15/2013  Patient:  [name],Victor Friedman   Account Number:  [id number]  Date Initiated:  08/15/2013  Documentation initiated by:  Vance PeperBRADY,Wynona Duhamel  Subjective/Objective Assessment:   58 yr old male s/p PLIF     Action/Plan:   Patient is under worker's comp. [place] .  Adjuster is Shanda BumpsJessica [phone]  Social worker, Amy Stuckey notified. Case manager will follow.   Anticipated DC Date:  08/16/2013   Anticipated DC Plan:           Choice offered to / List presented to:             Status of service:  In process, will continue to follow

## 2013-08-15 NOTE — Progress Notes (Signed)
Utilization review completed.  

## 2013-08-15 NOTE — Progress Notes (Signed)
OT Cancellation Note  Patient Details Name: Victor Friedman MRN: [id number] DOB: 05/23/1955   Cancelled Treatment:    Reason Eval/Treat Not Completed: Pain limiting ability to participate. Will re attempt later after pain meds given. Nursing aware of pain med request  Lafe GarinDenise J Yanis Juma 08/15/2013, 9:58 AM

## 2013-08-15 NOTE — Evaluation (Signed)
Occupational Therapy Evaluation Patient Details Name: Victor MerinoRobert M Friedman MRN: [id number] DOB: [birth year] Today's Date: 08/15/2013    History of Present Illness s/p PLIF L4-L5. Pt suffered work related injury resulting in spondylolisthesis.   Clinical Impression   Pt demonstrates decline in function and safety with ADLs and ADL mobility safety and would;d benefit from acute OT services to address impairments to increase level of function and safety. Eval limited due to pt reports of 7-10/10 pain after bed mobility to sit EOB. Pt unable to perform sit - stand or transfers. Pain meds had been given approximately 45 minutes per pt's nurse PTA of OT.     Follow Up Recommendations  SNF;Supervision/Assistance - 24 hour    Equipment Recommendations  None recommended by OT;Other (comment) (TBD at next venue of care)    Recommendations for Other Services       Precautions / Restrictions Precautions Precautions: Back;Fall Precaution Comments: reviewed back precautions and bed mobility technique with pt Required Braces or Orthoses: Spinal Brace Spinal Brace: Lumbar corset;Applied in sitting position Restrictions Weight Bearing Restrictions: No      Mobility Bed Mobility Overal bed mobility: Needs Assistance Bed Mobility: Sit to Sidelying;Rolling;Sidelying to Sit Rolling: Min guard Sidelying to sit: Min guard     Sit to sidelying: Min guard General bed mobility comments: bed flattened to simulate home; incr time required due to pain; min guard and max cues to faclitate log rolling technique   Transfers                 General transfer comment: attempted sit - stand x 3,  unable to stand due to pain. pain meds had been given approximately 45 minutes PTA. Per PT note pt is sup with transfers    Balance Overall balance assessment: Needs assistance Sitting-balance support: No upper extremity supported;Feet supported Sitting balance-Leahy Scale: Good         Standing balance  comment: unable to stand due to pain. pain meds had been given approximately 45 minutes PTA                            ADL Overall ADL's : Needs assistance/impaired     Grooming: Wash/dry hands;Wash/dry face;Sitting;Set up   Upper Body Bathing: Set up;Sitting   Lower Body Bathing: Maximal assistance;Sitting/lateral leans   Upper Body Dressing : Sitting   Lower Body Dressing: Total assistance;Sitting/lateral leans     Toilet Transfer Details (indicate cue type and reason): unable to stand due to pain. pain meds had been given approximately 45 minutes PTA. Per pT note, pt is sup with transfers           General ADL Comments: pt provided with education and demo of ADL A/E      Vision  wears glasses                              Pertinent Vitals/Pain 7-10/10 pin in back and L LE, VSS     Hand Dominance Right   Extremity/Trunk Assessment Upper Extremity Assessment Upper Extremity Assessment: Overall WFL for tasks assessed   Lower Extremity Assessment Lower Extremity Assessment: Defer to PT evaluation   Cervical / Trunk Assessment Cervical / Trunk Assessment: Normal   Communication Communication Communication: No difficulties   Cognition Arousal/Alertness: Awake/alert Behavior During Therapy: WFL for tasks assessed/performed Overall Cognitive Status: Within Functional Limits for tasks assessed  General Comments   Pt pleasant and cooperative                 Home Living Family/patient expects to be discharged to:: Skilled nursing facility                                 Additional Comments: pt lives alone in 2 story condom[place]. PTA pt was sleeping on floor in living room due to inability to ambulate up/down steps. no family/friends for (A)       Prior Functioning/Environment Level of Independence: Independent with assistive device(s)        Comments: ambulated with "walking stick"      OT Diagnosis: Acute pain   OT Problem List: Decreased knowledge of use of DME or AE;Pain;Impaired balance (sitting and/or standing);Decreased activity tolerance   OT Treatment/Interventions: Self-care/ADL training;Therapeutic exercise;Patient/family education;Neuromuscular education;Balance training;Therapeutic activities;DME and/or AE instruction    OT Goals(Current goals can be found in the care plan section) Acute Rehab OT Goals Patient Stated Goal: to get help then home  OT Goal Formulation: With patient Time For Goal Achievement: 08/22/13 Potential to Achieve Goals: Good ADL Goals Pt Will Perform Grooming: with min guard assist;standing Pt Will Perform Lower Body Bathing: with mod assist;sitting/lateral leans;sit to/from stand Pt Will Perform Lower Body Dressing: sitting/lateral leans;sit to/from stand;with max assist;with mod assist;with caregiver independent in assisting Pt Will Transfer to Toilet: with supervision;with modified independence;ambulating;grab bars (3 in 1 over toilet) Pt Will Perform Toileting - Clothing Manipulation and hygiene: with min assist;sit to/from stand  OT Frequency: Min 2X/week   Barriers to D/C: Decreased caregiver support  pt lives at home alone                     End of Session Equipment Utilized During Treatment: Engineer, waterolling walker Nurse Communication: Mobility status;Other (comment) (pain level)  Activity Tolerance: Patient limited by pain Patient left: in bed;with call bell/phone within reach   Time: 1044-1105 OT Time Calculation (min): 21 min Charges:  OT General Charges $OT Visit: 1 Procedure OT Evaluation $Initial OT Evaluation Tier I: 1 Procedure OT Treatments $Therapeutic Activity: 8-22 mins G-Codes:    Lafe GarinDenise J Demitrius Crass 08/15/2013, 1:49 PM

## 2013-08-16 ENCOUNTER — Inpatient Hospital Stay (HOSPITAL_COMMUNITY): Payer: Worker's Compensation

## 2013-08-16 MED ORDER — METHOCARBAMOL 500 MG PO TABS: 500.0000 mg | ORAL_TABLET | Freq: Four times a day (QID) | ORAL | Status: DC | PRN

## 2013-08-16 MED ORDER — KETOROLAC TROMETHAMINE 15 MG/ML IJ SOLN
15.0000 mg | Freq: Four times a day (QID) | INTRAMUSCULAR | Status: AC
  Administered 2013-08-16: 15 mg via INTRAMUSCULAR

## 2013-08-16 MED ORDER — OXYCODONE-ACETAMINOPHEN 5-325 MG PO TABS: 1.0000 | ORAL_TABLET | ORAL | Status: DC | PRN

## 2013-08-16 MED ORDER — KETOROLAC TROMETHAMINE 15 MG/ML IJ SOLN
15.0000 mg | Freq: Four times a day (QID) | INTRAMUSCULAR | Status: DC
  Filled 2013-08-16: qty 1

## 2013-08-16 MED ORDER — FLEET ENEMA 7-19 GM/118ML RE ENEM
1.0000 | ENEMA | Freq: Once | RECTAL | Status: AC
  Administered 2013-08-16: 1 via RECTAL
  Filled 2013-08-16: qty 1

## 2013-08-16 MED FILL — Heparin Sodium (Porcine) Inj 1000 Unit/ML: INTRAMUSCULAR | Qty: 30 | Status: AC

## 2013-08-16 MED FILL — Sodium Chloride IV Soln 0.9%: INTRAVENOUS | Qty: 1000 | Status: AC

## 2013-08-16 NOTE — Discharge Summary (Signed)
Physician Discharge Summary  Patient ID: Victor MerinoRobert M Friedman MRN: [id number] DOB/AGE: [birth year] 58 y.o.  Admit date: 08/14/2013 Discharge date: 08/16/2013  Admission Diagnoses: Spondylolisthesis L4-L5 with stenosis, lumbar radiculopathy  Discharge Diagnoses: Spondylolisthesis L4-L5 with stenosis, lumbar radiculopathy Active Problems:   Spondylolisthesis at L4-L5 level   Lumbar spondylosis   Discharged Condition: fair  Hospital Course: Patient was admitted to undergo surgical decompression arthrodesis at L4-L5. He tolerated the surgery well save for problem with left hip pain on day 2. X-rays are being performed.  Consults: None  Significant Diagnostic Studies: None  Treatments: surgery: Decompression L4-L5 posterior lumbar interbody arthrodesis with peek spacers local autograft allograft pedicle screw fixation L4-L5.  Discharge Exam: Blood pressure 146/98, pulse 85, temperature 98.5 F (36.9 C), temperature source Oral, resp. rate 20, height 6' (1.829 m), weight 94.348 kg (208 lb), SpO2 92.00%. significant left hip pain with mobility and weightbearing. Motor function is intact in lower extremities.  [facility]  Discharge Instructions   Call MD for:  redness, tenderness, or signs of infection (pain, swelling, redness, odor or green/yellow discharge around incision site)    Complete by:  As directed      Call MD for:  severe uncontrolled pain    Complete by:  As directed      Call MD for:  temperature >100.4    Complete by:  As directed      Diet - low sodium heart healthy    Complete by:  As directed      Discharge instructions    Complete by:  As directed   Okay to shower. Do not apply salves or appointments to incision. No heavy lifting with the upper extremities greater than 15 pounds. May resume driving when not requiring pain medication and patient feels comfortable with doing so.     Increase activity slowly    Complete by:  As directed              Medication List         cyclobenzaprine 10 MG tablet  Commonly known as:  FLEXERIL  Take 10 mg by mouth at bedtime.     famotidine 20 MG tablet  Commonly known as:  PEPCID  Take 20 mg by mouth daily.     HYDROcodone-acetaminophen 7.5-325 MG per tablet  Commonly known as:  NORCO  Take 2 tablets by mouth every 4 (four) hours as needed for moderate pain.     meloxicam 15 MG tablet  Commonly known as:  MOBIC  Take 15 mg by mouth daily.     methocarbamol 500 MG tablet  Commonly known as:  ROBAXIN  Take 1 tablet (500 mg total) by mouth every 6 (six) hours as needed for muscle spasms.     oxyCODONE-acetaminophen 5-325 MG per tablet  Commonly known as:  PERCOCET/ROXICET  Take 1-2 tablets by mouth every 4 (four) hours as needed for moderate pain.         SignedBarnett Abu: Kynleigh Artz 08/16/2013, 9:09 AM

## 2013-08-16 NOTE — Progress Notes (Signed)
Pt. Alert and oriented, follows simple instructions, denies pain. Incision area without swelling, redness or S/S of infection. Voiding adequate clear yellow urine. Moving all extremities well and vitals stable and documented. Anterior Cervical Fusion surgery notes instructions given to patient and family member for home safety and precautions. Pt. and family stated understanding of instructions given. Pain meds given per Pt.'s request for pain and discomfort of ride home. Report given to facility receiving nurse and enema given to patient to give to facility nurse to administer ASAP. Aisha RN

## 2013-08-16 NOTE — Progress Notes (Signed)
Physical Therapy Treatment Patient Details Name: Victor MerinoRobert M Friedman MRN: [id number] DOB: January 04, 1956 Today's Date: 08/16/2013    History of Present Illness s/p PLIF L4-L5. Pt suffered work related injury with a box in the freezer at [employer] resulting in spondylolisthesis.    PT Comments    Pt with continued RLE pain in bed with burning sensation with any touch and in standing reports Left hip pain with grinding arthritic type pain in the joint. Pt premedicated and reports pain radiating back down the leg with sit to stand and initiation of gait but with return to bed states pain 6/10 from knee distally bilaterally. Pt with increased mobility but continued pain limiting. RN and MD aware with further imaging ordered after session. Will follow as pain and pt status allow. Pt able to recall all precautions but requires cueing to maintain as pt with tray table to his side and reports continued twisting for items during the night, repositioned tray in front with specific education to not twist as this is pt's most difficult precaution to adhere to.   Follow Up Recommendations  SNF     Equipment Recommendations       Recommendations for Other Services       Precautions / Restrictions Precautions Precautions: Back;Fall Precaution Comments: reviewed back precautions and all mobility Required Braces or Orthoses: Spinal Brace Spinal Brace: Lumbar corset;Applied in sitting position    Mobility  Bed Mobility Overal bed mobility: Needs Assistance Bed Mobility: Rolling;Sidelying to Sit;Sit to Sidelying Rolling: Min guard Sidelying to sit: Min guard     Sit to sidelying: Min assist General bed mobility comments: cues for sequence, technique and precautions throughout with increased time  Transfers Overall transfer level: Needs assistance     Sit to Stand: Min assist         General transfer comment: cues for scooting forward, hand position and posture with  transfers  Ambulation/Gait Ambulation/Gait assistance: Min guard Ambulation Distance (Feet): 200 Feet Assistive device: Rolling walker (2 wheeled) Gait Pattern/deviations: Step-to pattern;Narrow base of support Gait velocity: decreased due to pain    General Gait Details: cues for back precautions during gt; limited mobility due to pain; decreased LLE hip pain with step to pattern   Stairs            Wheelchair Mobility    Modified Rankin (Stroke Patients Only)       Balance                                    Cognition Arousal/Alertness: Awake/alert Behavior During Therapy: WFL for tasks assessed/performed Overall Cognitive Status: Within Functional Limits for tasks assessed                      Exercises      General Comments        Pertinent Vitals/Pain 8/10 with transfer and walking, 6/10 in bed     Home Living                      Prior Function            PT Goals (current goals can now be found in the care plan section) Progress towards PT goals: Progressing toward goals    Frequency  Min 5X/week    PT Plan Current plan remains appropriate    Co-evaluation  End of Session Equipment Utilized During Treatment: Back brace Activity Tolerance: Patient limited by pain Patient left: in bed;with call bell/phone within reach     Time: 0750-0831 PT Time Calculation (min): 41 min  Charges:  $Gait Training: 8-22 mins $Therapeutic Activity: 23-37 mins                    G Codes:      Kariana Wiles B Detrich Rakestraw 08/16/2013, 9:22 AM Delaney MeigsMaija Tabor Lougenia Morrissey, PT [phone]

## 2013-08-16 NOTE — Progress Notes (Signed)
Patient ID: Victor MerinoRobert M Friedman, male   DOB: 08/25/1955, 58 y.o.   MRN: [id number] Complaining of left hip pain today. Pain is rather severe. Incision is clean and dry Motor function good in lower extremities Arrangements have been made for patient's recuperation. If x-rays okay we'll discharge.

## 2013-08-17 ENCOUNTER — Other Ambulatory Visit: Payer: Self-pay | Admitting: *Deleted

## 2013-08-17 MED ORDER — HYDROCODONE-ACETAMINOPHEN 7.5-325 MG PO TABS: 2.0000 | ORAL_TABLET | ORAL | Status: DC | PRN

## 2013-08-17 NOTE — Clinical Social Work Psychosocial (Signed)
Clinical Social Work Department  BRIEF PSYCHOSOCIAL ASSESSMENT  Patient: Victor Friedman  Account Number: [id number]   Admit date: 08/14/13 Clinical Social Worker Rhea Pink, MSW Date/Time: 08/15/13 Referred by: Physician Date Referred: 08/15/13 Referred for   SNF Placement   Other Referral:  Interview type: Patient at bedside Other interview type: PSYCHOSOCIAL DATA  Living Status: Alone- Patient reports no local supports Admitted from facility:  Level of care:  Primary support name: Cliffard Lee Primary support relationship to patient: Friend Degree of support available:  Poor  CURRENT CONCERNS  Current Concerns   Post-Acute Placement   Other Concerns:  SOCIAL WORK ASSESSMENT / PLAN  CSW met with pt at bedside to offers support and discuss SNF placement. Patient reports that he has no supports at home and wants to go to rehab for a week or two. 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 CSW spoke with RN CM Selinda Eon, who reported that patient has a bed at [facility]  Patient reported appreciation for CSW assist.  re: PT recommendation for SNF.   Pt lives alone  CSW explained placement process and answered questions.     CSW completed FL2 and sent clinicals to [facility]. And to RN CM with patient's worker's compensayion     Assessment/plan status: Information/Referral to [employer]  Other assessment/ plan:  Information/referral to community resources:  SNF   PTAR  PATIENT'S/FAMILY'S RESPONSE TO PLAN OF CARE:  Pt  reports he is agreeable to ST SNF in order to increase strength and independence with mobility prior to returning home  Pt verbalized understanding of placement process and appreciation for CSW assist.   Rhea Pink, MSW, [place]

## 2013-08-17 NOTE — Telephone Encounter (Signed)
[facility]  

## 2013-08-17 NOTE — Clinical Social Work Placement (Signed)
Clinical Social Work Department  CLINICAL SOCIAL WORK PLACEMENT NOTE   Patient: Victor Friedman  Account Number:   Admit date: 03/01/2012  Clinical Social Worker: Sabino Niemann LCSWA Date/time: 03/05/2012 11:30 AM  Clinical Social Work is seeking post-discharge placement for this patient at the following level of care: SKILLED NURSING (*CSW will update this form in Epic as items are completed)  03/05/2012 Patient/family provided with [facility] Department of Clinical Social Work's list of facilities offering this level of care within the geographic area requested by the patient (or if unable, by the patient's family).  03/05/2012 Patient/family informed of their freedom to choose among providers that offer the needed level of care, that participate in Medicare, Medicaid or managed care program needed by the patient, have an available bed and are willing to accept the patient.  03/05/2012 Patient/family informed of [facility]' ownership interest in [facility], as well as of the fact that they are under no obligation to receive care at this facility.  PASARR submitted to EDS on 03/05/2012  PASARR number received from EDS on 03/05/2012  FL2 transmitted to all facilities in geographic area requested by pt/family on 03/05/2012  FL2 transmitted to all facilities within larger geographic area on  Patient informed that his/her managed care company has contracts with or will negotiate with certain facilities, including the following:  Patient/family informed of bed offers received: 03/06/12  [facility] Physician recommends and patient chooses bed at  Patient to be transferred to on 08/16/13 Patient to be transferred to facility by Private Vehicle The following physician request were entered in Epic:  Additional Comments:   Clinical Social Worker will sign off for now as social work intervention is no longer needed. Please consult [place] again if new need  arises.

## 2013-08-21 ENCOUNTER — Encounter: Payer: Self-pay | Admitting: Internal Medicine

## 2013-08-21 ENCOUNTER — Non-Acute Institutional Stay (SKILLED_NURSING_FACILITY): Payer: Worker's Compensation | Admitting: Internal Medicine

## 2013-08-21 DIAGNOSIS — Z981 Arthrodesis status: Secondary | ICD-10-CM | POA: Diagnosis not present

## 2013-08-21 DIAGNOSIS — K219 Gastro-esophageal reflux disease without esophagitis: Secondary | ICD-10-CM | POA: Insufficient documentation

## 2013-08-21 NOTE — Progress Notes (Signed)
MRN: [id number] Name: Victor Friedman  Sex: male Age: 58 y.o. DOB: 06/19/1955  PSC #: [facility] Facility/Room: 129A Level Of Care: SNF Provider: Margit Hanks Emergency Contacts: Extended Emergency Contact Information Primary Emergency Contact: Lee,Cliford  [place] Home Phone: [phone] Relation: Other Code Status: FULL  Allergies: Review of patient's allergies indicates no known allergies.  Chief Complaint  Patient presents with  . nursing home admission    HPI: Patient is 58 y.o. male who who is s/p lumbar spine surgery and admitted for OT/PT.  Past Medical History  Diagnosis Date  . Anxiety   . Prostate troubles   . GERD (gastroesophageal reflux disease)     History reviewed. No pertinent past surgical history.    Medication List       This list is accurate as of: 08/21/13  9:53 PM.  Always use your most recent med list.               cyclobenzaprine 10 MG tablet  Commonly known as:  FLEXERIL  Take 10 mg by mouth at bedtime.     famotidine 20 MG tablet  Commonly known as:  PEPCID  Take 20 mg by mouth daily.     HYDROcodone-acetaminophen 7.5-325 MG per tablet  Commonly known as:  NORCO  Take 2 tablets by mouth every 4 (four) hours as needed for moderate pain.     meloxicam 15 MG tablet  Commonly known as:  MOBIC  Take 15 mg by mouth daily.     methocarbamol 500 MG tablet  Commonly known as:  ROBAXIN  Take 500 mg by mouth every 6 (six) hours as needed for muscle spasms.     oxyCODONE-acetaminophen 5-325 MG per tablet  Commonly known as:  PERCOCET/ROXICET  Take 1-2 tablets by mouth every 4 (four) hours as needed for moderate pain.        Meds ordered this encounter  Medications  . methocarbamol (ROBAXIN) 500 MG tablet    Sig: Take 500 mg by mouth every 6 (six) hours as needed for muscle spasms.     There is no immunization history on file for this patient.  History  Substance Use Topics  . Smoking status: Current Every  Day Smoker -- 0.50 packs/day for 30 years    Types: Cigarettes  . Smokeless tobacco: Not on file  . Alcohol Use: No    Family history is noncontributory    Review of Systems  DATA OBTAINED: from patient, nurse GENERAL: Feels well no fevers, fatigue, appetite changes SKIN: No itching, rash or wounds EYES: No eye pain, redness, discharge EARS: No earache, tinnitus, change in hearing NOSE: No congestion, drainage or bleeding  MOUTH/THROAT: No mouth or tooth pain, No sore throat RESPIRATORY: No cough, wheezing, SOB CARDIAC: No chest pain, palpitations, lower extremity edema  GI: No abdominal pain, No N/V/D or constipation, No heartburn or reflux  GU: No dysuria, frequency or urgency, or incontinence  MUSCULOSKELETAL: the second I hit the door pt began to c/o residual pain s/p surgery and not getting pain relief NEUROLOGIC: No headache, dizziness or focal weakness PSYCHIATRIC: No overt anxiety or sadness. Sleeps well. No behavior issue.   Filed Vitals:   08/21/13 2143  BP: 133/90  Pulse: 100  Temp: 98.1 F (36.7 C)  Resp: 18    Physical Exam  GENERAL APPEARANCE: Alert, conversant. Appropriately groomed. No acute distress.  SKIN: No diaphoresis rash HEAD: Normocephalic, atraumatic  EYES: Conjunctiva/lids clear. Pupils round, reactive. EOMs intact.  EARS: External exam WNL, canals clear. Hearing grossly normal.  NOSE: No deformity or discharge.  MOUTH/THROAT: Lips w/o lesions RESPIRATORY: Breathing is even, unlabored. Lung sounds are clear   CARDIOVASCULAR: Heart RRR no murmurs, rubs or gallops. No peripheral edema.  GASTROINTESTINAL: Abdomen is soft, non-tender, not distended w/ normal bowel sounds GENITOURINARY: Bladder non tender, not distended  MUSCULOSKELETAL: No abnormal joints or musculature; despite pain pt is able to walk in the parking lot with his walker NEUROLOGIC: Oriented X3. Cranial nerves 2-12 grossly intact. Moves all extremities no tremor. PSYCHIATRIC: Mood  and affect appropriate to situation, no behavioral issues  Patient Active Problem List   Diagnosis Date Noted  . Postsurgical arthrodesis status 08/21/2013  . GERD (gastroesophageal reflux disease)   . Spondylolisthesis at L4-L5 level 08/14/2013  . Lumbar spondylosis 08/14/2013    CBC    Component Value Date/Time   WBC 8.7 08/10/2013 1201   RBC 5.27 08/10/2013 1201   HGB 16.1 08/10/2013 1201   HCT 48.0 08/10/2013 1201   PLT 268 08/10/2013 1201   MCV 91.1 08/10/2013 1201    CMP     Component Value Date/Time   NA 138 08/10/2013 1201   K 4.3 08/10/2013 1201   CL 100 08/10/2013 1201   CO2 24 08/10/2013 1201   GLUCOSE 186* 08/10/2013 1201   BUN 16 08/10/2013 1201   CREATININE 0.63 08/10/2013 1201   CALCIUM 9.3 08/10/2013 1201   GFRNONAA >90 08/10/2013 1201   GFRAA >90 08/10/2013 1201    Assessment and Plan  Postsurgical arthrodesis status L4-5; pt on pain meds and muscle relaxers;pt c/o pain and it was decided to make his lortab 7.5 be scheduled q4. He has many residual sx after his surgery, radiating pain, etc which I will defer to N surg. Will try to get f/u moved up before 6/10.  GERD (gastroesophageal reflux disease) Continue pepcid 20 mg    Margit HanksAnne D Lorilei Horan, MD

## 2013-08-21 NOTE — Assessment & Plan Note (Signed)
L4-5; pt on pain meds and muscle relaxers;pt c/o pain and it was decided to make his lortab 7.5 be scheduled q4. He has many residual sx after his surgery, radiating pain, etc which I will defer to N surg. Will try to get f/u moved up before 6/10.

## 2013-08-21 NOTE — Assessment & Plan Note (Signed)
Continue pepcid 20 mg

## 2013-09-04 ENCOUNTER — Encounter: Payer: Self-pay | Admitting: Internal Medicine

## 2013-09-04 ENCOUNTER — Non-Acute Institutional Stay (SKILLED_NURSING_FACILITY): Payer: Worker's Compensation | Admitting: Internal Medicine

## 2013-09-04 DIAGNOSIS — K219 Gastro-esophageal reflux disease without esophagitis: Secondary | ICD-10-CM

## 2013-09-04 DIAGNOSIS — Z981 Arthrodesis status: Secondary | ICD-10-CM | POA: Diagnosis not present

## 2013-09-04 NOTE — Progress Notes (Signed)
MRN: [id number] Name: Victor Friedman  Sex: male Age: 58 y.o. DOB: Jul 04, 1955  [facility] Facility/Room: 129A Level Of Care: SNF Provider: Margit HanksAnne D Jeshua Ransford Emergency Contacts: Extended Emergency Contact Information Primary Emergency Contact: Lee,Cliford  [place] Home Phone: [phone] Mobile Phone: [phone] Relation: Other Secondary Emergency Contact: Shan Levansh,Julie  [place] Home Phone: [phone] Relation: None  Code Status: FULL  Allergies: Review of patient's allergies indicates no known allergies.  Chief Complaint  Patient presents with  . Discharge Note    HPI: Patient is 58 y.o. male who was admitted for OT/PT after back surgery and who is now ready to be discharged to home.  Past Medical History  Diagnosis Date  . Anxiety   . Prostate troubles   . GERD (gastroesophageal reflux disease)     History reviewed. No pertinent past surgical history.    Medication List       This list is accurate as of: 09/04/13  4:42 PM.  Always use your most recent med list.               cyclobenzaprine 10 MG tablet  Commonly known as:  FLEXERIL  Take 10 mg by mouth at bedtime.     cyclobenzaprine 10 MG tablet  Commonly known as:  FLEXERIL  Take 10 mg by mouth 2 (two) times daily.     famotidine 20 MG tablet  Commonly known as:  PEPCID  Take 20 mg by mouth daily.     HYDROcodone-acetaminophen 7.5-325 MG per tablet  Commonly known as:  NORCO  Take 2 tablets by mouth every 6 (six) hours as needed. 1-2 tableys q 6 prn     meloxicam 15 MG tablet  Commonly known as:  MOBIC  Take 15 mg by mouth daily.        Meds ordered this encounter  Medications  . cyclobenzaprine (FLEXERIL) 10 MG tablet    Sig: Take 10 mg by mouth 2 (two) times daily.  Marland Kitchen. HYDROcodone-acetaminophen (NORCO) 7.5-325 MG per tablet    Sig: Take 2 tablets by mouth every 6 (six) hours as needed. 1-2 tableys q 6 prn     There is no immunization history on file  for this patient.  History  Substance Use Topics  . Smoking status: Current Every Day Smoker -- 0.50 packs/day for 30 years    Types: Cigarettes  . Smokeless tobacco: Not on file  . Alcohol Use: No    Filed Vitals:   09/04/13 1641  BP: 125/80  Pulse: 87  Temp: 98.1 F (36.7 C)  Resp: 17    Physical Exam  GENERAL APPEARANCE: Alert, conversant. Appropriately groomed. No acute distress.  HEENT: Unremarkable. RESPIRATORY: Breathing is even, unlabored. Lung sounds are clear   CARDIOVASCULAR: Heart RRR no murmurs, rubs or gallops. No peripheral edema.  GASTROINTESTINAL: Abdomen is soft, non-tender, not distended w/ normal bowel sounds.  NEUROLOGIC: Cranial nerves 2-12 grossly intact. Moves all extremities no tremor.  Patient Active Problem List   Diagnosis Date Noted  . Postsurgical arthrodesis status 08/21/2013  . GERD (gastroesophageal reflux disease)   . Spondylolisthesis at L4-L5 level 08/14/2013  . Lumbar spondylosis 08/14/2013    CBC    Component Value Date/Time   WBC 8.7 08/10/2013 1201   RBC 5.27 08/10/2013 1201   HGB 16.1 08/10/2013 1201   HCT 48.0 08/10/2013 1201   PLT 268 08/10/2013 1201   MCV 91.1 08/10/2013 1201    CMP     Component  Value Date/Time   NA 138 08/10/2013 1201   K 4.3 08/10/2013 1201   CL 100 08/10/2013 1201   CO2 24 08/10/2013 1201   GLUCOSE 186* 08/10/2013 1201   BUN 16 08/10/2013 1201   CREATININE 0.63 08/10/2013 1201   CALCIUM 9.3 08/10/2013 1201   GFRNONAA >90 08/10/2013 1201   GFRAA >90 08/10/2013 1201    Assessment and Plan  Patient is being discharged to home in stable and improved condition with HH, OT,PT, cane, walker and reacher.   Margit Hanks, MD

## 2013-09-21 NOTE — OR Nursing (Signed)
Addendum to scope page 

## 2015-05-25 IMAGING — CR DG LUMBAR SPINE 2-3V
1 series · 1 of 1 positions shown · non-contrast
Comparison: MRI 04/18/2013

CLINICAL DATA: Spondylolisthesis.  L4-5 PLIF

EXAM:
LUMBAR SPINE - 2-3 VIEW

[lat]
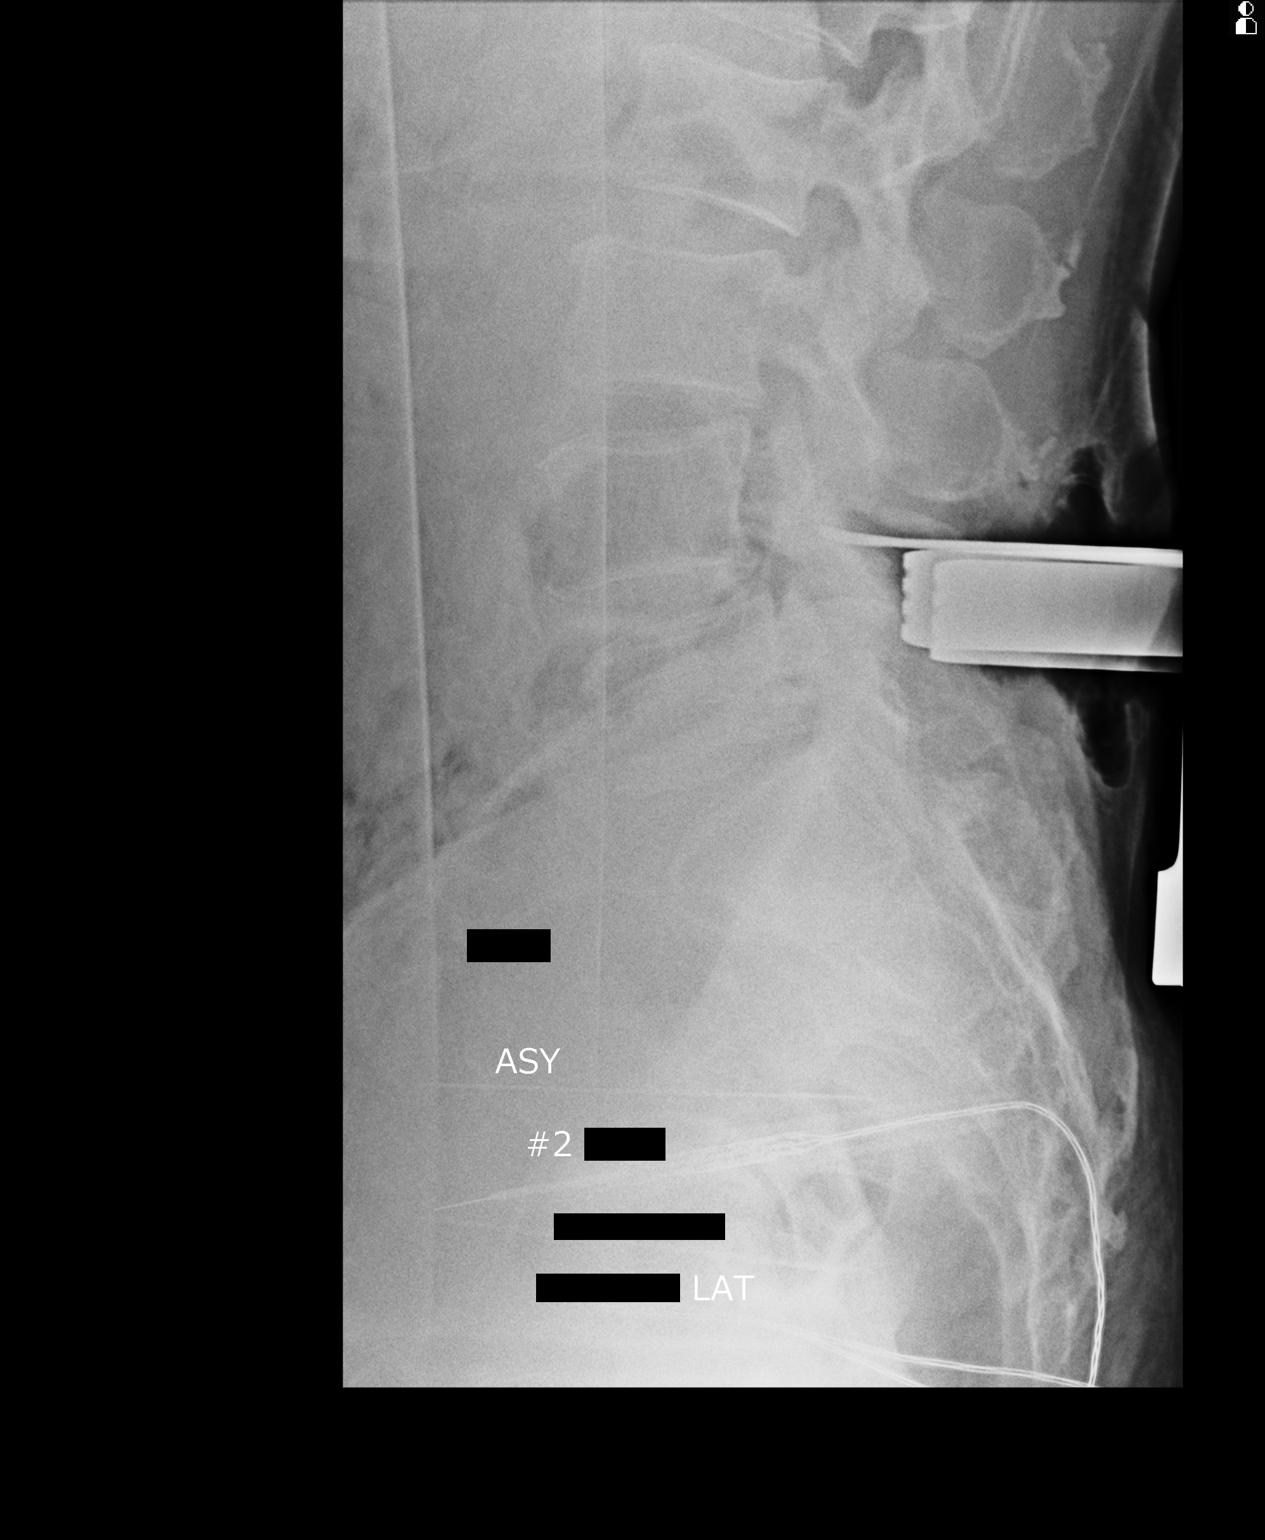

[1 of 1 positions shown; findings below may reference images not displayed]

FINDINGS: Posterior retractor is in place. Surgical instruments are identified
posterior to L5 and S1 vertebral bodies.
IMPRESSION: Intraoperative localization.

## 2015-05-25 IMAGING — RF DG C-ARM 61-120 MIN
1 series · 2 of 2 positions shown · non-contrast
Comparison: MRI dated 04/18/2013

CLINICAL DATA: Spinal stenosis and spondylolisthesis at L4-5.

EXAM:
LUMBAR SPINE - 2-3 VIEW; DG C-ARM 61-120 MIN

[Series 1: run · 2 of 2 slices shown]
[im 1/2]
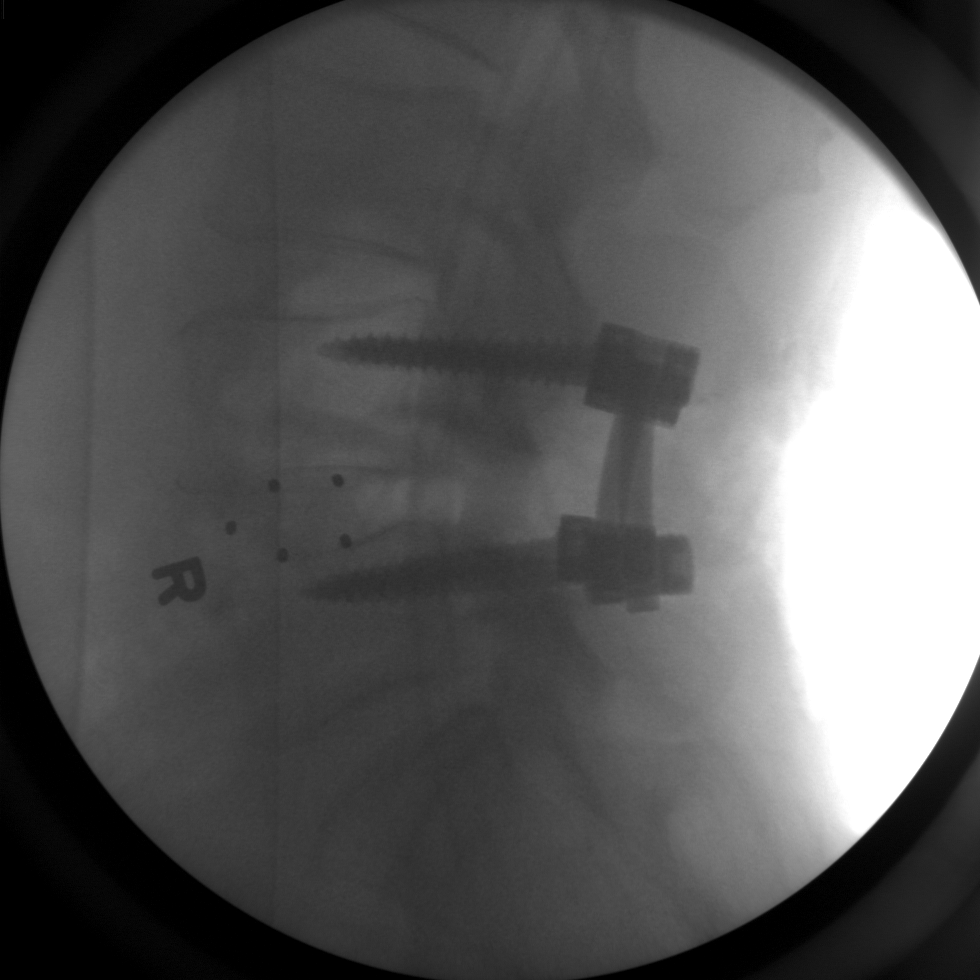
[im 2/2]
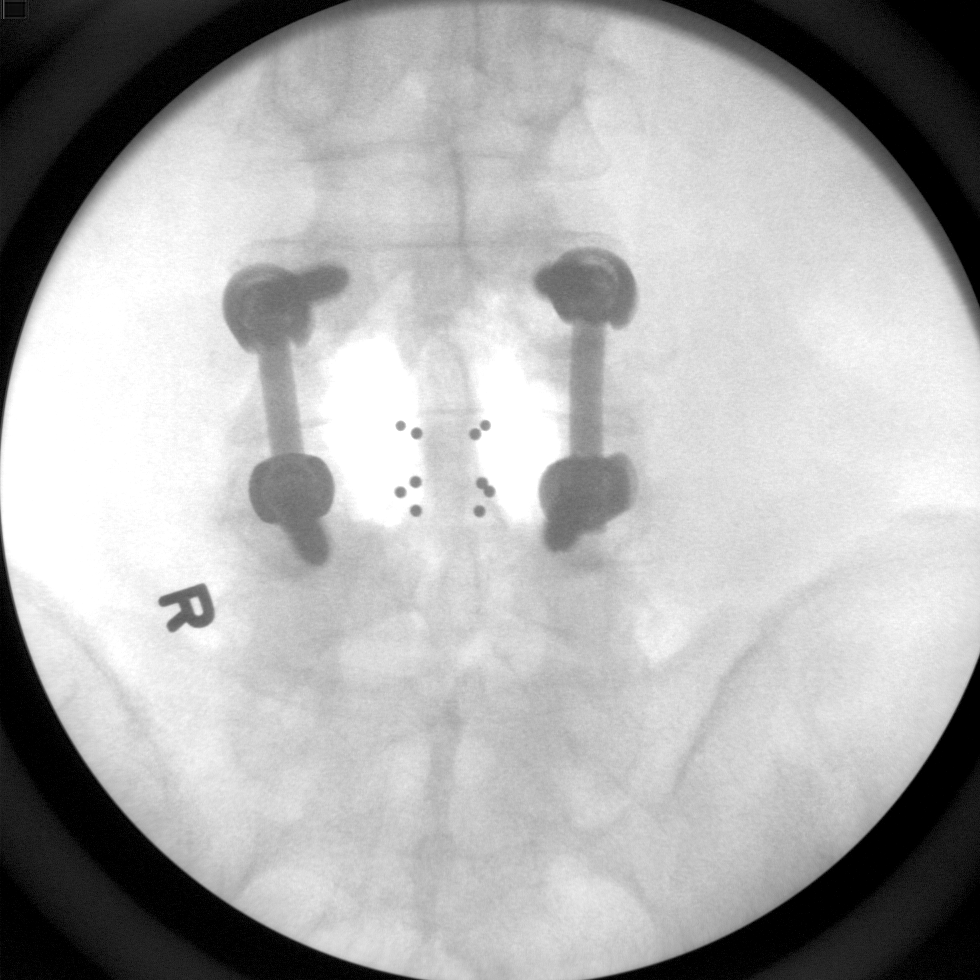

[2 of 2 positions shown; findings below may reference images not displayed]

FINDINGS: AP and lateral C-arm images demonstrate the patient has undergone
interbody and posterior fusion at L4-5.
IMPRESSION: Fusions performed at L4-5.

## 2015-05-27 IMAGING — CR DG HIP (WITH OR WITHOUT PELVIS) 2-3V*L*
3 series · 3 of 3 positions shown · non-contrast
Comparison: Lumbar spine series of August 16, 2013.

CLINICAL DATA: Two days postoperative from lumbar spine fusion now
with left hip and leg pain

EXAM:
LEFT HIP - COMPLETE 2+ VIEW

[t pelvis ap]
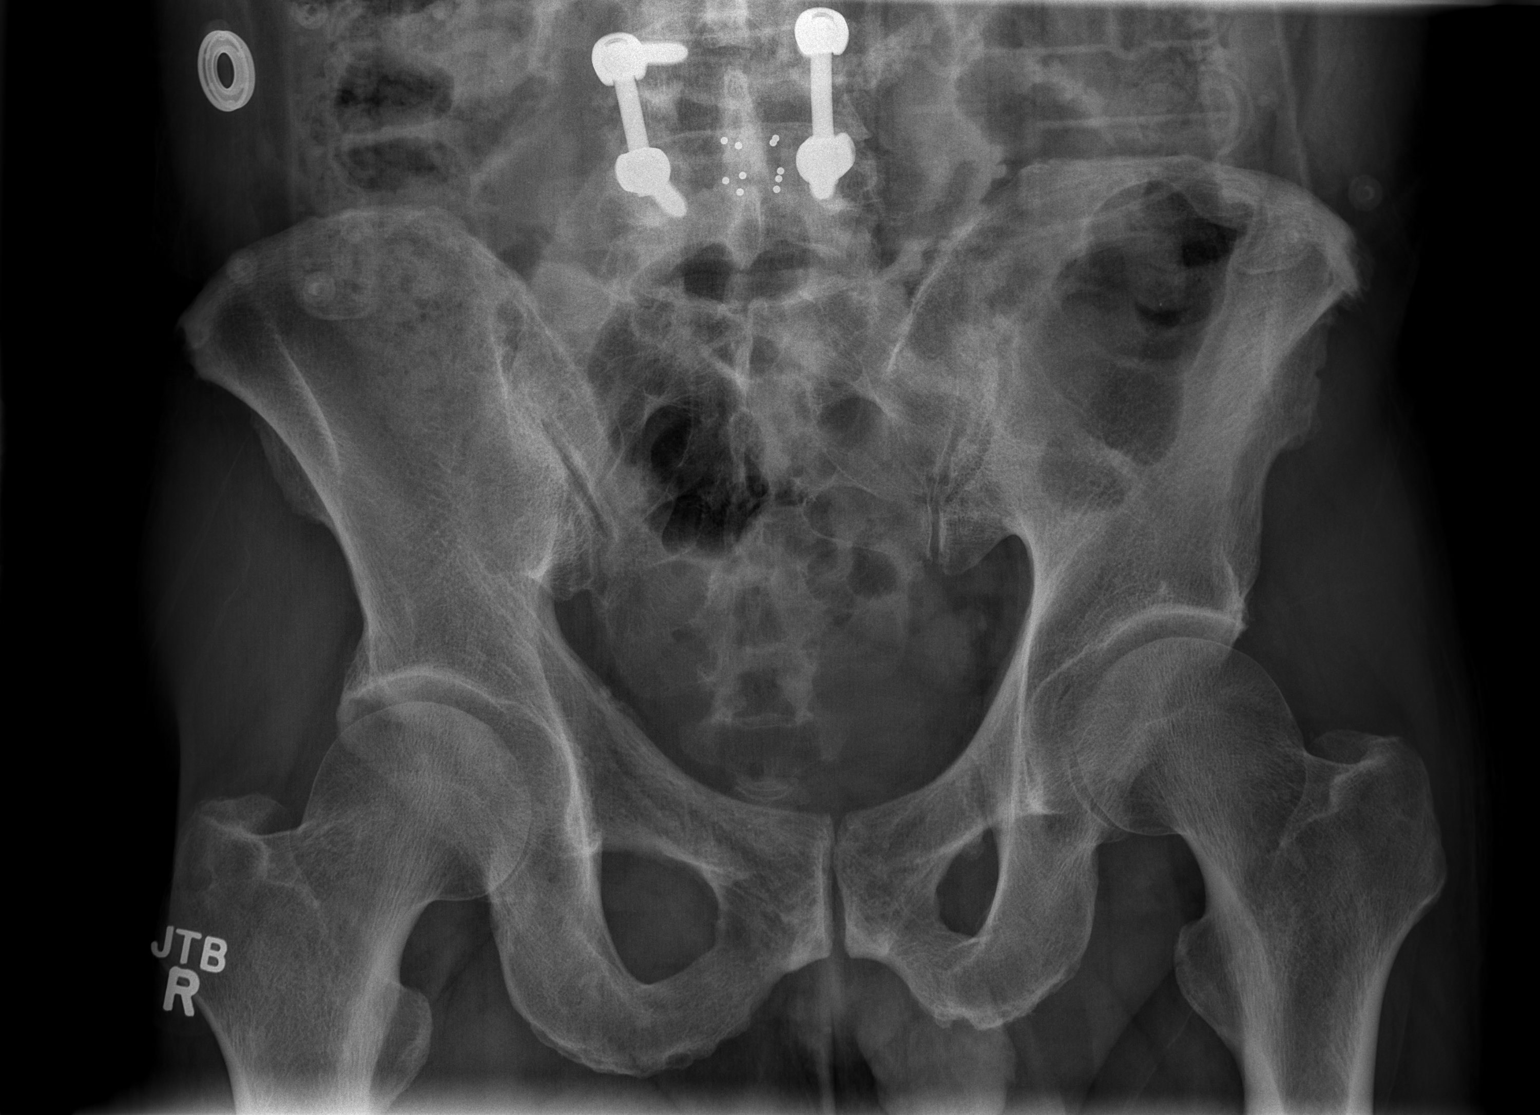

[t hip ap left]
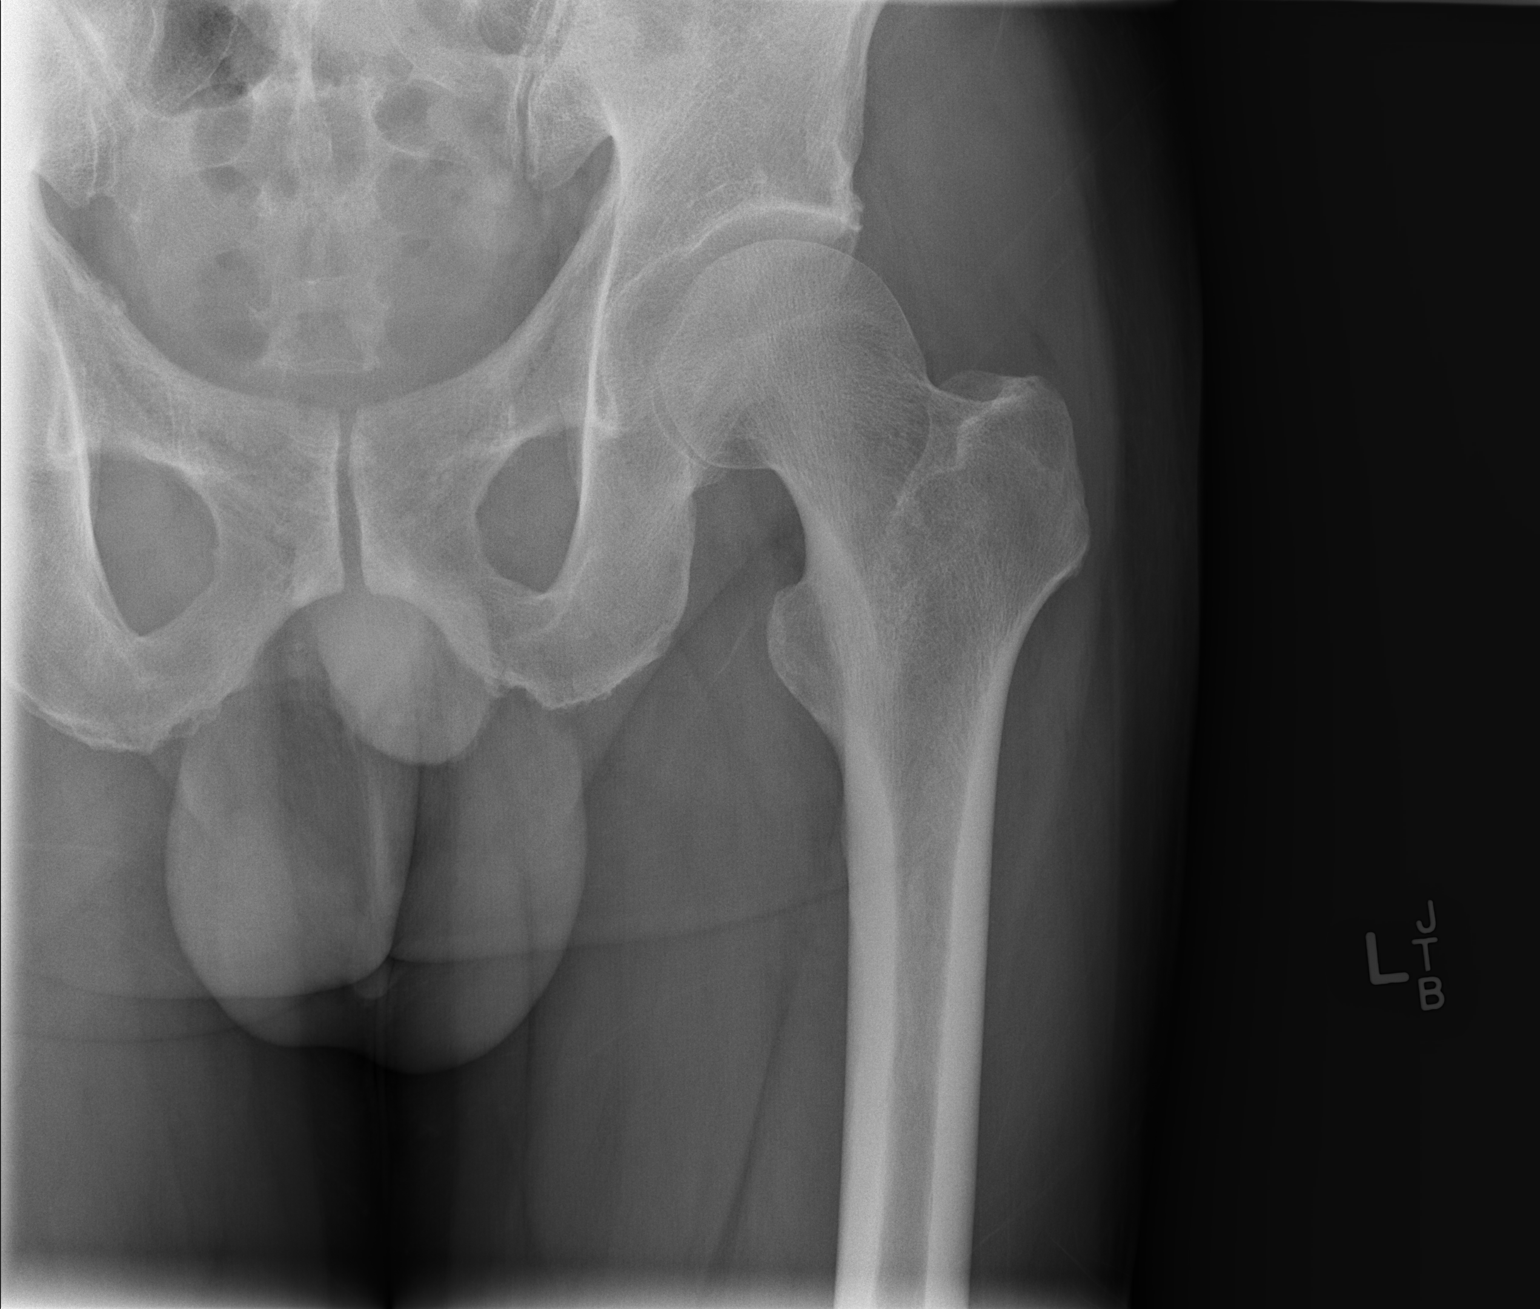

[t hip frog leg left]
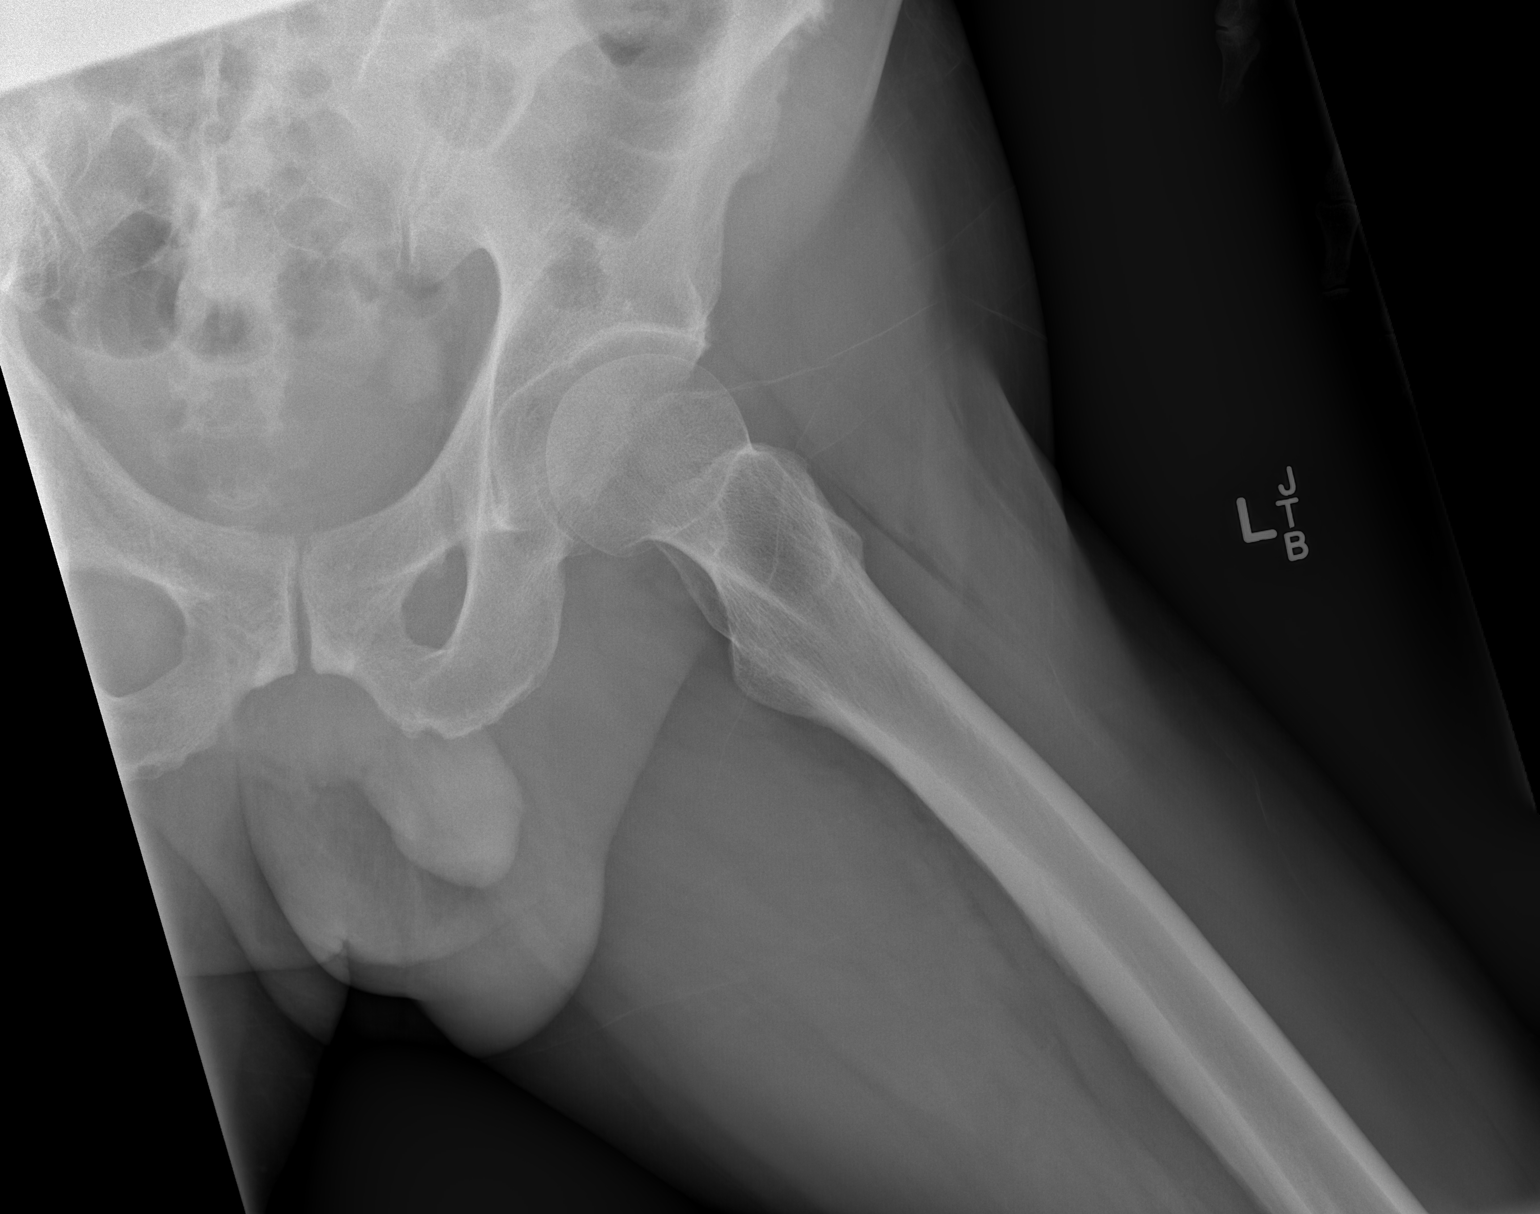

[3 of 3 positions shown; findings below may reference images not displayed]

FINDINGS: There is no evidence of hip fracture or dislocation. There is no
evidence of arthropathy or other focal bone abnormality. Posterior
fusion and intradiscal device placement at L4-5.
IMPRESSION: No acute abnormality of the left hip or bony pelvis.
